# Patient Record
Sex: Female | Born: 1955 | Race: White | Hispanic: No | Marital: Married | Smoking: Never smoker
Health system: Southern US, Community
[De-identification: ages and names within clinical notes are randomized; demographics above are authoritative.]

## PROBLEM LIST (undated history)

## (undated) DIAGNOSIS — Z9851 Tubal ligation status: Secondary | ICD-10-CM

## (undated) DIAGNOSIS — Z9889 Other specified postprocedural states: Secondary | ICD-10-CM

## (undated) DIAGNOSIS — Z923 Personal history of irradiation: Secondary | ICD-10-CM

## (undated) DIAGNOSIS — C50919 Malignant neoplasm of unspecified site of unspecified female breast: Secondary | ICD-10-CM

## (undated) DIAGNOSIS — R251 Tremor, unspecified: Secondary | ICD-10-CM

## (undated) DIAGNOSIS — M1990 Unspecified osteoarthritis, unspecified site: Secondary | ICD-10-CM

## (undated) DIAGNOSIS — G43909 Migraine, unspecified, not intractable, without status migrainosus: Secondary | ICD-10-CM

## (undated) DIAGNOSIS — F419 Anxiety disorder, unspecified: Secondary | ICD-10-CM

## (undated) DIAGNOSIS — M810 Age-related osteoporosis without current pathological fracture: Secondary | ICD-10-CM

## (undated) DIAGNOSIS — C801 Malignant (primary) neoplasm, unspecified: Secondary | ICD-10-CM

## (undated) DIAGNOSIS — Z9221 Personal history of antineoplastic chemotherapy: Secondary | ICD-10-CM

## (undated) DIAGNOSIS — Z8489 Family history of other specified conditions: Secondary | ICD-10-CM

## (undated) DIAGNOSIS — K219 Gastro-esophageal reflux disease without esophagitis: Secondary | ICD-10-CM

## (undated) HISTORY — DX: Other specified postprocedural states: Z98.890

## (undated) HISTORY — PX: TUBAL LIGATION: SHX77

## (undated) HISTORY — DX: Anxiety disorder, unspecified: F41.9

## (undated) HISTORY — DX: Tubal ligation status: Z98.51

## (undated) HISTORY — DX: Age-related osteoporosis without current pathological fracture: M81.0

## (undated) HISTORY — DX: Unspecified osteoarthritis, unspecified site: M19.90

## (undated) HISTORY — DX: Migraine, unspecified, not intractable, without status migrainosus: G43.909

---

## 1994-01-30 HISTORY — PX: BREAST BIOPSY: SHX20

## 2003-11-17 ENCOUNTER — Ambulatory Visit: Payer: Self-pay | Admitting: Internal Medicine

## 2004-12-20 ENCOUNTER — Ambulatory Visit: Payer: Self-pay | Admitting: Internal Medicine

## 2005-12-22 ENCOUNTER — Ambulatory Visit: Payer: Self-pay | Admitting: Internal Medicine

## 2005-12-27 ENCOUNTER — Ambulatory Visit: Payer: Self-pay | Admitting: Internal Medicine

## 2006-12-25 ENCOUNTER — Ambulatory Visit: Payer: Self-pay | Admitting: Internal Medicine

## 2007-02-07 ENCOUNTER — Ambulatory Visit: Payer: Self-pay | Admitting: Gastroenterology

## 2007-12-27 ENCOUNTER — Ambulatory Visit: Payer: Self-pay | Admitting: Internal Medicine

## 2008-12-29 ENCOUNTER — Ambulatory Visit: Payer: Self-pay | Admitting: Internal Medicine

## 2010-01-05 ENCOUNTER — Ambulatory Visit: Payer: Self-pay | Admitting: Internal Medicine

## 2011-01-17 ENCOUNTER — Ambulatory Visit: Payer: Self-pay | Admitting: Internal Medicine

## 2012-01-18 ENCOUNTER — Ambulatory Visit: Payer: Self-pay | Admitting: Internal Medicine

## 2012-01-31 HISTORY — PX: WRIST FRACTURE SURGERY: SHX121

## 2012-07-27 ENCOUNTER — Emergency Department: Payer: Self-pay | Admitting: Emergency Medicine

## 2012-07-29 ENCOUNTER — Ambulatory Visit: Payer: Self-pay | Admitting: Orthopedic Surgery

## 2013-02-27 ENCOUNTER — Ambulatory Visit: Payer: Self-pay | Admitting: Internal Medicine

## 2014-05-22 NOTE — Op Note (Signed)
PATIENT NAME:  Marisa Evans, STRASSNER MR#:  [id number] DATE OF BIRTH:  [birth year]  DATE OF PROCEDURE:  07/29/2012  PREOPERATIVE DIAGNOSIS:  Volarly displaced distal radius fracture, two part fracture.  POSTOPERATIVE DIAGNOSIS:  Volarly displaced distal radius fracture, two part fracture.   PROCEDURE:  ORIF right distal radius.   ANESTHESIA:  General.   SURGEON:  Laurene Footman, M.D.   DESCRIPTION OF PROCEDURE:  The patient was brought to the Operating Room and after adequate anesthesia was obtained, the right arm was prepped and draped in the usual sterile fashion with a tourniquet applied to the upper arm.  After patient identification and timeout procedures were completed, the tourniquet was raised and volar approach was made to the distal radius.  Incision was made over the FCR tendon.  The tendon sheath was incised and the tendon retracted radially to protect the radial artery.  The deep tendon sheath was incised and the pronator elevated off the distal radius.  A narrow, short DVR plate was then applied to the appropriate position and the three cortical screws were placed.  This helped reduce the volarly displaced distal fragment to essentially an anatomic position.  The distal peg holes were then filled using standard technique, drilling, measuring and placing the smooth pegs.  After all peg holes were filled, the wrist was examined under fluoroscopy.  There was no penetration into the joint and the fracture appeared very stable.  The tourniquet was let down and hemostasis checked with electrocautery.  The wound was closed with 3-0 Vicryl subcutaneously, 4-0 nylon for the skin, Xeroform, 4 x 4's, Webril and a volar splint were applied followed by an Ace wrap.    TOURNIQUET TIME:  23 minutes at 250 mmHg.    COMPLICATIONS:  There were no complications.   SPECIMEN:  No specimen.    ____________________________ Laurene Footman, MD mjm:ea D: 07/29/2012 22:08:02 ET T: 07/29/2012 22:45:52  ET JOB#: [id number]  cc: Laurene Footman, MD, <Dictator> Laurene Footman MD ELECTRONICALLY SIGNED 07/30/2012 6:57

## 2014-12-17 ENCOUNTER — Other Ambulatory Visit: Payer: Self-pay | Admitting: Internal Medicine

## 2014-12-17 DIAGNOSIS — Z1231 Encounter for screening mammogram for malignant neoplasm of breast: Secondary | ICD-10-CM

## 2014-12-29 ENCOUNTER — Ambulatory Visit
Admission: RE | Admit: 2014-12-29 | Discharge: 2014-12-29 | Disposition: A | Discharge status: Home / Self Care | Payer: PRIVATE HEALTH INSURANCE | Source: Ambulatory Visit | Attending: Internal Medicine | Admitting: Internal Medicine

## 2014-12-29 DIAGNOSIS — R922 Inconclusive mammogram: Secondary | ICD-10-CM | POA: Insufficient documentation

## 2014-12-29 DIAGNOSIS — Z1231 Encounter for screening mammogram for malignant neoplasm of breast: Secondary | ICD-10-CM | POA: Diagnosis present

## 2014-12-31 ENCOUNTER — Other Ambulatory Visit: Payer: Self-pay | Admitting: Internal Medicine

## 2014-12-31 DIAGNOSIS — R928 Other abnormal and inconclusive findings on diagnostic imaging of breast: Secondary | ICD-10-CM

## 2015-01-31 DIAGNOSIS — Z9221 Personal history of antineoplastic chemotherapy: Secondary | ICD-10-CM

## 2015-01-31 DIAGNOSIS — Z923 Personal history of irradiation: Secondary | ICD-10-CM

## 2015-01-31 HISTORY — DX: Personal history of irradiation: Z92.3

## 2015-01-31 HISTORY — PX: BREAST LUMPECTOMY: SHX2

## 2015-01-31 HISTORY — PX: BREAST BIOPSY: SHX20

## 2015-01-31 HISTORY — DX: Personal history of antineoplastic chemotherapy: Z92.21

## 2015-02-03 ENCOUNTER — Ambulatory Visit
Admission: RE | Admit: 2015-02-03 | Discharge: 2015-02-03 | Disposition: A | Discharge status: Home / Self Care | Payer: Managed Care, Other (non HMO) | Source: Ambulatory Visit | Attending: Internal Medicine | Admitting: Internal Medicine

## 2015-02-03 DIAGNOSIS — R928 Other abnormal and inconclusive findings on diagnostic imaging of breast: Secondary | ICD-10-CM | POA: Insufficient documentation

## 2015-02-05 ENCOUNTER — Other Ambulatory Visit: Payer: Self-pay | Admitting: Internal Medicine

## 2015-02-05 DIAGNOSIS — N63 Unspecified lump in unspecified breast: Secondary | ICD-10-CM

## 2015-02-09 ENCOUNTER — Ambulatory Visit
Admission: RE | Admit: 2015-02-09 | Discharge: 2015-02-09 | Disposition: A | Discharge status: Home / Self Care | Payer: Managed Care, Other (non HMO) | Source: Ambulatory Visit | Attending: Internal Medicine | Admitting: Internal Medicine

## 2015-02-09 DIAGNOSIS — N63 Unspecified lump in unspecified breast: Secondary | ICD-10-CM

## 2015-02-09 DIAGNOSIS — C50212 Malignant neoplasm of upper-inner quadrant of left female breast: Secondary | ICD-10-CM | POA: Insufficient documentation

## 2015-02-11 ENCOUNTER — Inpatient Hospital Stay: Payer: Managed Care, Other (non HMO) | Attending: Oncology | Admitting: Oncology

## 2015-02-11 ENCOUNTER — Encounter: Payer: Self-pay | Admitting: Oncology

## 2015-02-11 VITALS — BP 145/87 | HR 77 | Temp 96.8°F | Wt 155.6 lb

## 2015-02-11 DIAGNOSIS — Z170 Estrogen receptor positive status [ER+]: Secondary | ICD-10-CM | POA: Diagnosis not present

## 2015-02-11 DIAGNOSIS — Z79899 Other long term (current) drug therapy: Secondary | ICD-10-CM | POA: Diagnosis not present

## 2015-02-11 DIAGNOSIS — Z808 Family history of malignant neoplasm of other organs or systems: Secondary | ICD-10-CM | POA: Diagnosis not present

## 2015-02-11 DIAGNOSIS — Z8669 Personal history of other diseases of the nervous system and sense organs: Secondary | ICD-10-CM | POA: Diagnosis not present

## 2015-02-11 DIAGNOSIS — M818 Other osteoporosis without current pathological fracture: Secondary | ICD-10-CM | POA: Insufficient documentation

## 2015-02-11 DIAGNOSIS — F419 Anxiety disorder, unspecified: Secondary | ICD-10-CM | POA: Insufficient documentation

## 2015-02-11 DIAGNOSIS — C50912 Malignant neoplasm of unspecified site of left female breast: Secondary | ICD-10-CM

## 2015-02-11 DIAGNOSIS — M129 Arthropathy, unspecified: Secondary | ICD-10-CM | POA: Diagnosis not present

## 2015-02-11 DIAGNOSIS — C50212 Malignant neoplasm of upper-inner quadrant of left female breast: Secondary | ICD-10-CM | POA: Insufficient documentation

## 2015-02-11 LAB — SURGICAL PATHOLOGY

## 2015-02-12 DIAGNOSIS — C50919 Malignant neoplasm of unspecified site of unspecified female breast: Secondary | ICD-10-CM | POA: Insufficient documentation

## 2015-02-12 DIAGNOSIS — C50812 Malignant neoplasm of overlapping sites of left female breast: Secondary | ICD-10-CM | POA: Insufficient documentation

## 2015-02-12 HISTORY — PX: BREAST BIOPSY: SHX20

## 2015-02-12 NOTE — Progress Notes (Signed)
[place]  [phone] [phone]) [phone]  ID: Marisa Evans OB: 1955/10/13  MR#: [id number]  DEY#:[id number]  No care team member to display  CHIEF COMPLAINT:  Chief Complaint  Patient presents with  . New Patient (Initial Visit)    new L breast mass    INTERVAL HISTORY: patient is a 60 year old female who was noted to have an abnormality on routine screening mammogram. Subsequent ultrasound and biopsy confirmed invasive lobular carcinoma. She currently feels well and is asymptomatic. She has no neurologic complaints. She denies any recent fevers. She has a good appetite and denies weight loss. She has no chest pain or shortness of breath. She denies any nausea, vomiting, constipation, or diarrhea. She has no urinary complaints. Patient feels at her baseline and offers no specific complaints today.  REVIEW OF SYSTEMS:   Review of Systems  Constitutional: Negative for fever, weight loss and malaise/fatigue.  Respiratory: Negative.   Cardiovascular: Negative.   Gastrointestinal: Negative.   Musculoskeletal: Negative.   Neurological: Negative.  Negative for weakness.    As per HPI. Otherwise, a complete review of systems is negatve.  PAST MEDICAL HISTORY: Past Medical History  Diagnosis Date  . Anxiety   . Migraine   . H/O tubal ligation   . Osteoporosis   . Arthritis   . H/O breast biopsy     PAST SURGICAL HISTORY: Past Surgical History  Procedure Laterality Date  . Breast biopsy Bilateral 1996    neg    FAMILY HISTORY Family History  Problem Relation Age of Onset  . Hypertension Father   . Cancer Maternal Uncle   . Cancer Paternal Uncle   . Liver disease Paternal Grandfather        ADVANCED DIRECTIVES:    HEALTH MAINTENANCE: Social History  Substance Use Topics  . Smoking status: Never Smoker   . Smokeless tobacco: None  . Alcohol Use: Yes     Comment: twice a year     Colonoscopy:  PAP:  Bone  density:  Lipid panel:  Allergies  Allergen Reactions  . Codeine Nausea Only  . Topamax [Topiramate] Other (See Comments)    Lightheaded, feels "out of body"    Current Outpatient Prescriptions  Medication Sig Dispense Refill  . alendronate (FOSAMAX) 70 MG tablet TAKE 1 TABLET BY MOUTH EVERY WEEK    . calcium-vitamin D 250-100 MG-UNIT tablet Take 2 tablets by mouth 2 (two) times daily.    Marland Kitchen glucosamine-chondroitin 500-400 MG tablet Take 1 tablet by mouth 3 (three) times daily.    . propranolol (INDERAL) 10 MG tablet Take 10 mg by mouth 2 (two) times daily.    Marland Kitchen venlafaxine XR (EFFEXOR-XR) 150 MG 24 hr capsule TAKE ONE CAPSULE BY MOUTH EVERY DAY    . Cholecalciferol (D 1000) 1000 units capsule Take 1,000 Units by mouth daily.    . Glucosamine Sulfate 500 MG TABS Take by mouth.     No current facility-administered medications for this visit.    OBJECTIVE: Filed Vitals:   02/11/15 1044  BP: 145/87  Pulse: 77  Temp: 96.8 F (36 C)     There is no height on file to calculate BMI.    ECOG FS:0 - Asymptomatic  General: Well-developed, well-nourished, no acute distress. Eyes: Pink conjunctiva, anicteric sclera. Breasts: Patient requested exam be deferred today. HEENT: Normocephalic, moist mucous membranes, clear oropharnyx. Lungs: Clear to auscultation bilaterally. Heart: Regular rate and rhythm. No rubs, murmurs, or gallops. Abdomen: Soft, nontender, nondistended. No organomegaly  noted, normoactive bowel sounds. Musculoskeletal: No edema, cyanosis, or clubbing. Neuro: Alert, answering all questions appropriately. Cranial nerves grossly intact. Skin: No rashes or petechiae noted. Psych: Normal affect. Lymphatics: No cervical, calvicular, axillary or inguinal LAD.   LAB RESULTS:  No results found for: NA, K, CL, CO2, GLUCOSE, BUN, CREATININE, CALCIUM, PROT, ALBUMIN, AST, ALT, ALKPHOS, BILITOT, GFRNONAA, GFRAA  No results found for: WBC, NEUTROABS, HGB, HCT, MCV,  PLT   STUDIES: Mm Digital Diagnostic Unilat L  02/09/2015  CLINICAL DATA:  Post biopsy clip mammograms following ultrasound-guided core needle biopsy of the left breast. EXAM: DIAGNOSTIC LEFT MAMMOGRAM POST ULTRASOUND BIOPSY COMPARISON:  Previous exam(s). FINDINGS: Mammographic images were obtained following altered guided biopsy of hypoechoic left breast mass and associated architectural distortion. The wing shaped biopsy clip lies within the area of distortion in the upper inner left breast. IMPRESSION: Well-positioned wing shaped biopsy clip following ultrasound-guided core needle biopsy of a left breast mass and associated distortion. Final Assessment: Post Procedure Mammograms for Marker Placement Electronically Signed   By: Lajean Manes M.D.   On: 02/09/2015 15:18   US Breast Ltd Uni Left Inc Axilla  02/03/2015  CLINICAL DATA:  Patient recalled from screening for left breast architectural distortion. EXAM: DIGITAL DIAGNOSTIC LEFT MAMMOGRAM WITH CAD ULTRASOUND LEFT BREAST COMPARISON:  Previous exam(s). ACR Breast Density Category c: The breast tissue is heterogeneously dense, which may obscure small masses. FINDINGS: CC and MLO tomosynthesis images of the left breast were obtained, demonstrating persistent architectural distortion within the upper inner left breast middle to posterior depth with associated 9 mm mass. Magnification views of the upper inner left breast were obtained as well demonstrating approximately 3 cm of associated punctate calcifications extending from the mass anteriorly towards the nipple. Punctate calcifications within the upper-outer left breast are stable dating back to 2005. Mammographic images were processed with CAD. On physical exam, I palpate no discrete mass within the upper-outer left breast. Targeted ultrasound is performed, showing a 2.0 x 0.7 x 1.4 cm irregular hypoechoic left breast mass 11 o'clock position 4 cm from the nipple. No left axillary lymphadenopathy.  IMPRESSION: Suspicious left breast mass, concerning for primary breast malignancy. 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 RECOMMENDATION: Ultrasound-guided core needle biopsy left breast mass, concerning for malignancy. Depending on pathology, consider either stereotactic guided core needle biopsy of the anterior most aspect of the calcifications or preoperative localization for inclusion of the calcifications at time of surgery. I have discussed the findings and recommendations with the patient. Results were also provided in writing at the conclusion of the visit. If applicable, a reminder letter will be sent to the patient regarding the next appointment. BI-RADS CATEGORY  5: Highly suggestive of malignancy. Electronically Signed   By: Lovey Newcomer M.D.   On: 02/03/2015 12:58   Mm Diag Breast Tomo Uni Left  02/03/2015  CLINICAL DATA:  Patient recalled from screening for left breast architectural distortion. EXAM: DIGITAL DIAGNOSTIC LEFT MAMMOGRAM WITH CAD ULTRASOUND LEFT BREAST COMPARISON:  Previous exam(s). ACR Breast Density Category c: The breast tissue is heterogeneously dense, which may obscure small masses. FINDINGS: CC and MLO tomosynthesis images of the left breast were obtained, demonstrating persistent architectural distortion within the upper inner left breast middle to posterior depth with associated 9 mm mass. Magnification views of the upper inner left breast were obtained as well demonstrating approximately 3 cm of associated punctate calcifications extending  from the mass anteriorly towards the nipple. Punctate calcifications within the upper-outer left breast are stable dating back to 2005. Mammographic images were processed with CAD. On physical exam, I palpate no discrete mass within the upper-outer left breast. Targeted ultrasound is performed, showing a 2.0 x 0.7 x 1.4 cm irregular  hypoechoic left breast mass 11 o'clock position 4 cm from the nipple. No left axillary lymphadenopathy. IMPRESSION: Suspicious left breast mass, concerning for primary breast malignancy. 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 RECOMMENDATION: Ultrasound-guided core needle biopsy left breast mass, concerning for malignancy. Depending on pathology, consider either stereotactic guided core needle biopsy of the anterior most aspect of the calcifications or preoperative localization for inclusion of the calcifications at time of surgery. I have discussed the findings and recommendations with the patient. Results were also provided in writing at the conclusion of the visit. If applicable, a reminder letter will be sent to the patient regarding the next appointment. BI-RADS CATEGORY  5: Highly suggestive of malignancy. Electronically Signed   By: Lovey Newcomer M.D.   On: 02/03/2015 12:58   [place] Lt Breast Bx W Loc Dev 1st Lesion Img Bx Spec US Guide  02/09/2015  CLINICAL DATA:  Patient presents for ultrasound-guided core needle biopsy of irregular hypoechoic mass in the left breast at the 11 o'clock position. EXAM: ULTRASOUND GUIDED LEFT BREAST CORE NEEDLE BIOPSY COMPARISON:  Previous exam(s). PROCEDURE: I met with the patient and we discussed the procedure of ultrasound-guided biopsy, including benefits and alternatives. We discussed the high likelihood of a successful procedure. We discussed the risks of the procedure including infection, bleeding, tissue injury, clip migration, and inadequate sampling. Informed written consent was given. The usual time-out protocol was performed immediately prior to the procedure. Using sterile technique and 1% Lidocaine as local anesthetic, under direct ultrasound visualization, a 12 gauge vacuum-assisted device was used to perform biopsy of the hypoechoic left breast  massusing an inferior approach. At the conclusion of the procedure, a wing shaped tissue marker clip was deployed into the biopsy cavity. Follow-up 2-view mammogram was performed and dictated separately. IMPRESSION: Ultrasound-guided biopsy of a left breast mass. No apparent complications. Electronically Signed   By: Lajean Manes M.D.   On: 02/09/2015 15:06    ASSESSMENT: Clinical stage IIa ER/PR+, invasive lobular carcinoma of the left breast.  PLAN:    1. Breast cancer:  Given the stage of patient's disease, she may benefit from chemotherapy. Although patient's with invasive lobular carcinoma may not respond to chemotherapy. Patient will have consultation with surgery in the next 1-2 weeks to discuss lumpectomy versus mastectomy. If she requires chemotherapy, she may need port placement as well. She will probably require an MRI to determine the true extent of her malignancy since lobular carcinomas may not show completely on mammogram. She may or may not benefit from radiation therapy depending on the type of surgery she chooses. Ultimately, patient will require aromatase inhibitor for a minimum of 5 years. Return to clinic in 2 weeks after her surgical appointment for further treatment planning.  Approximately 45 minutes was spent in discussion of which greater than 50% was consultation.  Patient expressed understanding and was in agreement with this plan. She also understands that She can call clinic at any time with any questions, concerns, or complaints.   Breast cancer [facility])   Staging form: Breast, AJCC 7th Edition     Clinical stage  from 02/12/2015: Stage IIA (T2, N0, M0) - Signed by Lloyd Huger, MD on 02/12/2015   Lloyd Huger, MD   02/12/2015 1:49 PM

## 2015-02-25 ENCOUNTER — Other Ambulatory Visit: Payer: Managed Care, Other (non HMO)

## 2015-02-25 ENCOUNTER — Inpatient Hospital Stay (HOSPITAL_BASED_OUTPATIENT_CLINIC_OR_DEPARTMENT_OTHER): Payer: Managed Care, Other (non HMO) | Admitting: Oncology

## 2015-02-25 ENCOUNTER — Encounter: Payer: Self-pay | Admitting: *Deleted

## 2015-02-25 VITALS — BP 131/86 | HR 76 | Temp 97.7°F | Resp 18 | Wt 155.6 lb

## 2015-02-25 DIAGNOSIS — Z8669 Personal history of other diseases of the nervous system and sense organs: Secondary | ICD-10-CM

## 2015-02-25 DIAGNOSIS — C50212 Malignant neoplasm of upper-inner quadrant of left female breast: Secondary | ICD-10-CM

## 2015-02-25 DIAGNOSIS — Z808 Family history of malignant neoplasm of other organs or systems: Secondary | ICD-10-CM

## 2015-02-25 DIAGNOSIS — M129 Arthropathy, unspecified: Secondary | ICD-10-CM

## 2015-02-25 DIAGNOSIS — M818 Other osteoporosis without current pathological fracture: Secondary | ICD-10-CM

## 2015-02-25 DIAGNOSIS — C50912 Malignant neoplasm of unspecified site of left female breast: Secondary | ICD-10-CM

## 2015-02-25 DIAGNOSIS — Z170 Estrogen receptor positive status [ER+]: Secondary | ICD-10-CM | POA: Diagnosis not present

## 2015-02-25 DIAGNOSIS — F419 Anxiety disorder, unspecified: Secondary | ICD-10-CM | POA: Diagnosis not present

## 2015-02-25 DIAGNOSIS — Z79899 Other long term (current) drug therapy: Secondary | ICD-10-CM | POA: Diagnosis not present

## 2015-02-25 MED ORDER — LIDOCAINE-PRILOCAINE 2.5-2.5 % EX CREA: 1.0000 "application " | TOPICAL_CREAM | CUTANEOUS | Status: DC | PRN

## 2015-02-25 NOTE — Progress Notes (Signed)
Patient is scheduled for port placement on 03/02/15.

## 2015-02-25 NOTE — Patient Instructions (Signed)
  Your procedure is scheduled on: 03-02-15 Report to [place] To find out your arrival time please call [phone] between 1PM - 3PM on 03-01-15  Remember: Instructions that are not followed completely may result in serious medical risk, up to and including death, or upon the discretion of your surgeon and anesthesiologist your surgery may need to be rescheduled.    _X___ 1. Do not eat food or drink liquids after midnight. No gum chewing or hard candies.     _X___ 2. No Alcohol for 24 hours before or after surgery.   ____ 3. Bring all medications with you on the day of surgery if instructed.    ____ 4. Notify your doctor if there is any change in your medical condition     (cold, fever, infections).     Do not wear jewelry, make-up, hairpins, clips or nail polish.  Do not wear lotions, powders, or perfumes. You may wear deodorant.  Do not shave 48 hours prior to surgery. Men may shave face and neck.  Do not bring valuables to the hospital.    [facility] is not responsible for any belongings or valuables.               Contacts, dentures or bridgework may not be worn into surgery.  Leave your suitcase in the car. After surgery it may be brought to your room.  For patients admitted to the hospital, discharge time is determined by your treatment team.   Patients discharged the day of surgery will not be allowed to drive home.   Please read over the following fact sheets that you were given:    _X___ Take these medicines the morning of surgery with A SIP OF WATER:    1. PROPRANOLOL  2.   3.   4.  5.  6.  ____ Fleet Enema (as directed)   ____ Use CHG Soap as directed  ____ Use inhalers on the day of surgery  ____ Stop metformin 2 days prior to surgery    ____ Take 1/2 of usual insulin dose the night before surgery and none on the morning of surgery.   ____ Stop Coumadin/Plavix/aspirin-N/A  ____ Stop Anti-inflammatories-NO NSAIDS OR ASA  PRODUCTS-TYLENOL OK TO TAKE   _X___ Stop supplements until after surgery-STOP GLUCOSAMINE AND OSTEO-BIFLEX NOW  ____ Bring C-Pap to the hospital.

## 2015-02-28 NOTE — Progress Notes (Signed)
[place]  [phone] [phone]  ID: Marisa Evans OB: 03/22/1955  MR#: [id number]  JAS#:[id number]  Patient Care Team: Adin Hector, MD as PCP - General (Internal Medicine)  CHIEF COMPLAINT:  Chief Complaint  Patient presents with  . Breast Cancer    INTERVAL HISTORY: Patient returns to clinic today for further evaluation, diagnostic planning, and treatment planning. She currently feels well and is asymptomatic. She has no neurologic complaints. She denies any recent fevers. She has a good appetite and denies weight loss. She has no chest pain or shortness of breath. She denies any nausea, vomiting, constipation, or diarrhea. She has no urinary complaints. Patient offers no specific complaints today.  REVIEW OF SYSTEMS:   Review of Systems  Constitutional: Negative for fever, weight loss and malaise/fatigue.  Respiratory: Negative.   Cardiovascular: Negative.   Gastrointestinal: Negative.   Musculoskeletal: Negative.   Neurological: Negative.  Negative for weakness.    As per HPI. Otherwise, a complete review of systems is negatve.  PAST MEDICAL HISTORY: Past Medical History  Diagnosis Date  . Anxiety   . Migraine   . H/O tubal ligation   . Osteoporosis   . Arthritis   . H/O breast biopsy   . GERD (gastroesophageal reflux disease)     NO MEDS  . Cancer ([place])   . Family history of adverse reaction to anesthesia     PTS UNCLE   . Tremor     TAKES PROPRANOLOL FOR TREMORS    PAST SURGICAL HISTORY: Past Surgical History  Procedure Laterality Date  . Wrist fracture surgery Right 2014  . Breast biopsy Bilateral 1996    neg  . Breast biopsy Left 2017    FAMILY HISTORY Family History  Problem Relation Age of Onset  . Hypertension Father   . Cancer Maternal Uncle   . Cancer Paternal Uncle   . Liver disease Paternal Grandfather        ADVANCED DIRECTIVES:    HEALTH MAINTENANCE: Social History  Substance Use  Topics  . Smoking status: Never Smoker   . Smokeless tobacco: Not on file  . Alcohol Use: No     Colonoscopy:  PAP:  Bone density:  Lipid panel:  Allergies  Allergen Reactions  . Codeine Nausea Only  . Topamax [Topiramate] Other (See Comments)    Lightheaded, feels "out of body"    Current Outpatient Prescriptions  Medication Sig Dispense Refill  . alendronate (FOSAMAX) 70 MG tablet TAKE 1 TABLET BY MOUTH EVERY WEEK    . calcium-vitamin D 250-100 MG-UNIT tablet Take 2 tablets by mouth 2 (two) times daily. Reported on 02/25/2015    . Cholecalciferol (D 1000) 1000 units capsule Take 1,000 Units by mouth daily.    . Glucosamine Sulfate 500 MG TABS Take by mouth. Reported on 02/25/2015    . lidocaine-prilocaine (EMLA) cream Apply 1 application topically as needed. Apply to port then cover with saran wrap, 1-2 hours before chemotherapy appointment 30 g 2  . propranolol (INDERAL) 10 MG tablet Take 10 mg by mouth 2 (two) times daily.    Marland Kitchen venlafaxine XR (EFFEXOR-XR) 150 MG 24 hr capsule TAKE ONE CAPSULE BY MOUTH EVERY DAY-PM    . Misc Natural Products (OSTEO BI-FLEX ADV DOUBLE ST) TABS Take 1 tablet by mouth daily.     No current facility-administered medications for this visit.    OBJECTIVE: Filed Vitals:   02/25/15 1147  BP: 131/86  Pulse: 76  Temp: 97.7 F (  36.5 C)  Resp: 18     There is no height on file to calculate BMI.    ECOG FS:0 - Asymptomatic  General: Well-developed, well-nourished, no acute distress. Eyes: Pink conjunctiva, anicteric sclera. Breasts: Patient requested exam be deferred today. Lungs: Clear to auscultation bilaterally. Heart: Regular rate and rhythm. No rubs, murmurs, or gallops. Abdomen: Soft, nontender, nondistended. No organomegaly noted, normoactive bowel sounds. Musculoskeletal: No edema, cyanosis, or clubbing. Neuro: Alert, answering all questions appropriately. Cranial nerves grossly intact. Skin: No rashes or petechiae noted. Psych: Normal  affect.  LAB RESULTS:  No results found for: NA, K, CL, CO2, GLUCOSE, BUN, CREATININE, CALCIUM, PROT, ALBUMIN, AST, ALT, ALKPHOS, BILITOT, GFRNONAA, GFRAA  No results found for: WBC, NEUTROABS, HGB, HCT, MCV, PLT   STUDIES: Mm Digital Diagnostic Unilat L  02/09/2015  CLINICAL DATA:  Post biopsy clip mammograms following ultrasound-guided core needle biopsy of the left breast. EXAM: DIAGNOSTIC LEFT MAMMOGRAM POST ULTRASOUND BIOPSY COMPARISON:  Previous exam(s). FINDINGS: Mammographic images were obtained following altered guided biopsy of hypoechoic left breast mass and associated architectural distortion. The wing shaped biopsy clip lies within the area of distortion in the upper inner left breast. IMPRESSION: Well-positioned wing shaped biopsy clip following ultrasound-guided core needle biopsy of a left breast mass and associated distortion. Final Assessment: Post Procedure Mammograms for Marker Placement Electronically Signed   By: Lajean Manes M.D.   On: 02/09/2015 15:18   US Breast Ltd Uni Left Inc Axilla  02/03/2015  CLINICAL DATA:  Patient recalled from screening for left breast architectural distortion. EXAM: DIGITAL DIAGNOSTIC LEFT MAMMOGRAM WITH CAD ULTRASOUND LEFT BREAST COMPARISON:  Previous exam(s). ACR Breast Density Category c: The breast tissue is heterogeneously dense, which may obscure small masses. FINDINGS: CC and MLO tomosynthesis images of the left breast were obtained, demonstrating persistent architectural distortion within the upper inner left breast middle to posterior depth with associated 9 mm mass. Magnification views of the upper inner left breast were obtained as well demonstrating approximately 3 cm of associated punctate calcifications extending from the mass anteriorly towards the nipple. Punctate calcifications within the upper-outer left breast are stable dating back to 2005. Mammographic images were processed with CAD. On physical exam, I palpate no discrete mass  within the upper-outer left breast. Targeted ultrasound is performed, showing a 2.0 x 0.7 x 1.4 cm irregular hypoechoic left breast mass 11 o'clock position 4 cm from the nipple. No left axillary lymphadenopathy. IMPRESSION: Suspicious left breast mass, concerning for primary breast malignancy. 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 RECOMMENDATION: Ultrasound-guided core needle biopsy left breast mass, concerning for malignancy. Depending on pathology, consider either stereotactic guided core needle biopsy of the anterior most aspect of the calcifications or preoperative localization for inclusion of the calcifications at time of surgery. I have discussed the findings and recommendations with the patient. Results were also provided in writing at the conclusion of the visit. If applicable, a reminder letter will be sent to the patient regarding the next appointment. BI-RADS CATEGORY  5: Highly suggestive of malignancy. Electronically Signed   By: Lovey Newcomer M.D.   On: 02/03/2015 12:58   Mm Diag Breast Tomo Uni Left  02/03/2015  CLINICAL DATA:  Patient recalled from screening for left breast architectural distortion. EXAM: DIGITAL DIAGNOSTIC LEFT MAMMOGRAM WITH CAD ULTRASOUND LEFT BREAST COMPARISON:  Previous exam(s). ACR Breast Density Category c: The breast tissue is heterogeneously dense, which  may obscure small masses. FINDINGS: CC and MLO tomosynthesis images of the left breast were obtained, demonstrating persistent architectural distortion within the upper inner left breast middle to posterior depth with associated 9 mm mass. Magnification views of the upper inner left breast were obtained as well demonstrating approximately 3 cm of associated punctate calcifications extending from the mass anteriorly towards the nipple. Punctate calcifications within the upper-outer left breast are stable dating  back to 2005. Mammographic images were processed with CAD. On physical exam, I palpate no discrete mass within the upper-outer left breast. Targeted ultrasound is performed, showing a 2.0 x 0.7 x 1.4 cm irregular hypoechoic left breast mass 11 o'clock position 4 cm from the nipple. No left axillary lymphadenopathy. IMPRESSION: Suspicious left breast mass, concerning for primary breast malignancy. 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 RECOMMENDATION: Ultrasound-guided core needle biopsy left breast mass, concerning for malignancy. Depending on pathology, consider either stereotactic guided core needle biopsy of the anterior most aspect of the calcifications or preoperative localization for inclusion of the calcifications at time of surgery. I have discussed the findings and recommendations with the patient. Results were also provided in writing at the conclusion of the visit. If applicable, a reminder letter will be sent to the patient regarding the next appointment. BI-RADS CATEGORY  5: Highly suggestive of malignancy. Electronically Signed   By: Lovey Newcomer M.D.   On: 02/03/2015 12:58   [place] Lt Breast Bx W Loc Dev 1st Lesion Img Bx Spec US Guide  02/12/2015  ADDENDUM REPORT: 02/12/2015 14:25 ADDENDUM: Pathology of the left breast biopsy revealed INVASIVE LOBULAR CARCINOMA, SOLID VARIANT. LOBULAR CARCINOMA IN SITU. Histologic grade: Grade 2. Comment: Immunohistochemistry for E-cadherin was performed, and the invasive and in situ carcinoma are negative, confirming the above diagnosis. There is only focal lobular carcinoma in situ in this sample, and no calcifications are observed. No lymphovascular invasion is identified. The diagnosis of invasive mammary carcinoma was communicated to Dr. Caryl Comes on 02/10/15 at 1:40 PM; he was notified that further classification and marker studies were pending. The final  report was communicated to Al Pimple, [place] at [facility], on 02/10/14, at 11:52 by pathologist. Read-back was performed. This was found to be concordant by Dr. Autumn Patty. Recommendations:  Recommend surgical and oncology referral. Multiple attempts were made to contact the patient by Jetta Lout, [facility]) for a post biopsy check but were unsuccessful. According to notes in Epic, the results were discussed with the patient by Dr. Grayland Ormond, oncologist. Surgical appointment is pending. Addendum by Jetta Lout, RRA on 02/12/15. Electronically Signed   By: Lajean Manes M.D.   On: 02/12/2015 14:25  02/12/2015  CLINICAL DATA:  Patient presents for ultrasound-guided core needle biopsy of irregular hypoechoic mass in the left breast at the 11 o'clock position. EXAM: ULTRASOUND GUIDED LEFT BREAST CORE NEEDLE BIOPSY COMPARISON:  Previous exam(s). PROCEDURE: I met with the patient and we discussed the procedure of ultrasound-guided biopsy, including benefits and alternatives. We discussed the high likelihood of a successful procedure. We discussed the risks of the procedure including infection, bleeding, tissue injury, clip migration, and inadequate sampling. Informed written consent was given. The usual time-out protocol was performed immediately prior to the procedure. Using sterile technique and 1% Lidocaine as local anesthetic, under direct ultrasound visualization, a 12 gauge vacuum-assisted device was used to perform biopsy of the hypoechoic left breast massusing an  inferior approach. At the conclusion of the procedure, a wing shaped tissue marker clip was deployed into the biopsy cavity. Follow-up 2-view mammogram was performed and dictated separately. IMPRESSION: Ultrasound-guided biopsy of a left breast mass. No apparent complications. Electronically Signed: By: Lajean Manes M.D. On: 02/09/2015 15:06    ASSESSMENT: Clinical stage IIa ER/PR+, invasive lobular carcinoma  of the left breast.  PLAN:    1. Breast cancer:  Given the stage of patient's disease, she may benefit from chemotherapy. Although patient's with invasive lobular carcinoma may not respond to chemotherapy. We will get MRI of the breast prior to initiating chemotherapy to further evaluate her lesion. Appreciate surgical input, patient has port placement scheduled for March 02, 2015.  She may or may not benefit from radiation therapy depending on the type of surgery she chooses. Ultimately, patient will require aromatase inhibitor for a minimum of 5 years. Will get MUGA scan prior to initiating treatment. Return to clinic in 2 weeks to initiate cycle 1 of 4 of neoadjuvant Adriamycin and Cytoxan. This will be followed by 12 weeks of Taxol.   Approximately 30 minutes was spent in discussion of which greater than 50% was consultation.  Patient expressed understanding and was in agreement with this plan. She also understands that She can call clinic at any time with any questions, concerns, or complaints.   Breast cancer [facility])   Staging form: Breast, AJCC 7th Edition     Clinical stage from 02/12/2015: Stage IIA (T2, N0, M0) - Signed by Lloyd Huger, MD on 02/12/2015   Lloyd Huger, MD   02/28/2015 7:10 AM

## 2015-03-01 ENCOUNTER — Other Ambulatory Visit: Payer: Self-pay | Admitting: Oncology

## 2015-03-01 ENCOUNTER — Encounter: Payer: Self-pay | Admitting: *Deleted

## 2015-03-01 DIAGNOSIS — C50912 Malignant neoplasm of unspecified site of left female breast: Secondary | ICD-10-CM

## 2015-03-01 MED ORDER — ONDANSETRON HCL 8 MG PO TABS: 8.0000 mg | ORAL_TABLET | Freq: Two times a day (BID) | ORAL | Status: DC | PRN

## 2015-03-01 MED ORDER — PROCHLORPERAZINE MALEATE 10 MG PO TABS: 10.0000 mg | ORAL_TABLET | Freq: Four times a day (QID) | ORAL | Status: DC | PRN

## 2015-03-01 MED ORDER — LIDOCAINE-PRILOCAINE 2.5-2.5 % EX CREA: TOPICAL_CREAM | CUTANEOUS | Status: DC

## 2015-03-02 ENCOUNTER — Encounter: Payer: Self-pay | Admitting: *Deleted

## 2015-03-02 ENCOUNTER — Encounter
Admission: RE | Disposition: A | Discharge status: Home / Self Care | Payer: Self-pay | Source: Ambulatory Visit | Attending: Surgery

## 2015-03-02 ENCOUNTER — Ambulatory Visit: Payer: Managed Care, Other (non HMO)

## 2015-03-02 ENCOUNTER — Ambulatory Visit: Payer: Managed Care, Other (non HMO) | Admitting: Anesthesiology

## 2015-03-02 ENCOUNTER — Ambulatory Visit
Admission: RE | Admit: 2015-03-02 | Discharge: 2015-03-02 | Disposition: A | Discharge status: Home / Self Care | Payer: Managed Care, Other (non HMO) | Source: Ambulatory Visit | Attending: Surgery | Admitting: Surgery

## 2015-03-02 DIAGNOSIS — Z79899 Other long term (current) drug therapy: Secondary | ICD-10-CM | POA: Insufficient documentation

## 2015-03-02 DIAGNOSIS — Z8261 Family history of arthritis: Secondary | ICD-10-CM | POA: Diagnosis not present

## 2015-03-02 DIAGNOSIS — R251 Tremor, unspecified: Secondary | ICD-10-CM | POA: Diagnosis not present

## 2015-03-02 DIAGNOSIS — Z8249 Family history of ischemic heart disease and other diseases of the circulatory system: Secondary | ICD-10-CM | POA: Diagnosis not present

## 2015-03-02 DIAGNOSIS — Z170 Estrogen receptor positive status [ER+]: Secondary | ICD-10-CM | POA: Diagnosis not present

## 2015-03-02 DIAGNOSIS — C50212 Malignant neoplasm of upper-inner quadrant of left female breast: Secondary | ICD-10-CM | POA: Insufficient documentation

## 2015-03-02 DIAGNOSIS — Z825 Family history of asthma and other chronic lower respiratory diseases: Secondary | ICD-10-CM | POA: Insufficient documentation

## 2015-03-02 DIAGNOSIS — D72819 Decreased white blood cell count, unspecified: Secondary | ICD-10-CM | POA: Diagnosis not present

## 2015-03-02 DIAGNOSIS — Z8042 Family history of malignant neoplasm of prostate: Secondary | ICD-10-CM | POA: Insufficient documentation

## 2015-03-02 DIAGNOSIS — Z8262 Family history of osteoporosis: Secondary | ICD-10-CM | POA: Diagnosis not present

## 2015-03-02 DIAGNOSIS — M810 Age-related osteoporosis without current pathological fracture: Secondary | ICD-10-CM | POA: Diagnosis not present

## 2015-03-02 DIAGNOSIS — Z9889 Other specified postprocedural states: Secondary | ICD-10-CM

## 2015-03-02 DIAGNOSIS — G43909 Migraine, unspecified, not intractable, without status migrainosus: Secondary | ICD-10-CM | POA: Insufficient documentation

## 2015-03-02 HISTORY — DX: Family history of other specified conditions: Z84.89

## 2015-03-02 HISTORY — DX: Tremor, unspecified: R25.1

## 2015-03-02 HISTORY — PX: PORTACATH PLACEMENT: SHX2246

## 2015-03-02 HISTORY — DX: Gastro-esophageal reflux disease without esophagitis: K21.9

## 2015-03-02 HISTORY — DX: Malignant (primary) neoplasm, unspecified: C80.1

## 2015-03-02 SURGERY — INSERTION, TUNNELED CENTRAL VENOUS DEVICE, WITH PORT
Anesthesia: Monitor Anesthesia Care | Site: Chest | Laterality: Right | Wound class: Clean

## 2015-03-02 MED ORDER — CEFAZOLIN SODIUM 1-5 GM-% IV SOLN
1.0000 g | Freq: Once | INTRAVENOUS | Status: AC
  Administered 2015-03-02: 1 g via INTRAVENOUS

## 2015-03-02 MED ORDER — ONDANSETRON HCL 4 MG/2ML IJ SOLN: 4.0000 mg | Freq: Once | INTRAMUSCULAR | Status: DC | PRN

## 2015-03-02 MED ORDER — SODIUM CHLORIDE 0.9 % IJ SOLN
INTRAMUSCULAR | Status: AC
  Filled 2015-03-02: qty 50

## 2015-03-02 MED ORDER — MIDAZOLAM HCL 2 MG/2ML IJ SOLN
INTRAMUSCULAR | Status: DC | PRN
  Administered 2015-03-02: 2 mg via INTRAVENOUS

## 2015-03-02 MED ORDER — HEPARIN SODIUM (PORCINE) 5000 UNIT/ML IJ SOLN
INTRAMUSCULAR | Status: AC
  Filled 2015-03-02: qty 1

## 2015-03-02 MED ORDER — PROPOFOL 500 MG/50ML IV EMUL
INTRAVENOUS | Status: DC | PRN
  Administered 2015-03-02: 50 ug/kg/min via INTRAVENOUS

## 2015-03-02 MED ORDER — FAMOTIDINE 20 MG PO TABS
20.0000 mg | ORAL_TABLET | Freq: Once | ORAL | Status: AC
  Administered 2015-03-02: 20 mg via ORAL

## 2015-03-02 MED ORDER — FENTANYL CITRATE (PF) 100 MCG/2ML IJ SOLN: 25.0000 ug | INTRAMUSCULAR | Status: DC | PRN

## 2015-03-02 MED ORDER — LIDOCAINE HCL (PF) 1 % IJ SOLN
INTRAMUSCULAR | Status: DC | PRN
  Administered 2015-03-02: 8 mL

## 2015-03-02 MED ORDER — LIDOCAINE HCL (PF) 1 % IJ SOLN
INTRAMUSCULAR | Status: AC
Start: 2015-03-02 — End: 2015-03-02
  Filled 2015-03-02: qty 30

## 2015-03-02 MED ORDER — FENTANYL CITRATE (PF) 100 MCG/2ML IJ SOLN
INTRAMUSCULAR | Status: DC | PRN
  Administered 2015-03-02: 25 ug via INTRAVENOUS

## 2015-03-02 MED ORDER — SODIUM CHLORIDE 0.9 % IV SOLN
INTRAVENOUS | Status: DC | PRN
  Administered 2015-03-02: 12 mL via INTRAMUSCULAR

## 2015-03-02 MED ORDER — FAMOTIDINE 20 MG PO TABS
ORAL_TABLET | ORAL | Status: AC
  Administered 2015-03-02: 20 mg via ORAL
  Filled 2015-03-02: qty 1

## 2015-03-02 MED ORDER — LIDOCAINE HCL (PF) 1 % IJ SOLN
INTRAMUSCULAR | Status: AC
  Filled 2015-03-02: qty 30

## 2015-03-02 MED ORDER — CEFAZOLIN SODIUM 1-5 GM-% IV SOLN
INTRAVENOUS | Status: AC
  Administered 2015-03-02: 1 g via INTRAVENOUS
  Filled 2015-03-02: qty 50

## 2015-03-02 MED ORDER — LACTATED RINGERS IV SOLN
INTRAVENOUS | Status: DC
  Administered 2015-03-02: 15:00:00 via INTRAVENOUS

## 2015-03-02 SURGICAL SUPPLY — 23 items
CANISTER SUCT 1200ML W/VALVE (MISCELLANEOUS) ×3 IMPLANT
CHLORAPREP W/TINT 26ML (MISCELLANEOUS) ×3 IMPLANT
COVER LIGHT HANDLE STERIS (MISCELLANEOUS) ×6 IMPLANT
DRAPE C-ARM XRAY 36X54 (DRAPES) ×3 IMPLANT
ELECT REM PT RETURN 9FT ADLT (ELECTROSURGICAL) ×3
ELECTRODE REM PT RTRN 9FT ADLT (ELECTROSURGICAL) ×1 IMPLANT
GLOVE BIO SURGEON STRL SZ7.5 (GLOVE) ×3 IMPLANT
GOWN STRL REUS W/ TWL LRG LVL3 (GOWN DISPOSABLE) ×3 IMPLANT
GOWN STRL REUS W/TWL LRG LVL3 (GOWN DISPOSABLE) ×6
KIT PORT POWER 8FR ISP CVUE (Catheter) ×3 IMPLANT
KIT RM TURNOVER STRD PROC AR (KITS) ×3 IMPLANT
LABEL OR SOLS (LABEL) IMPLANT
LIQUID BAND (GAUZE/BANDAGES/DRESSINGS) ×3 IMPLANT
NEEDLE FILTER BLUNT 18X 1/2SAF (NEEDLE) ×2
NEEDLE FILTER BLUNT 18X1 1/2 (NEEDLE) ×1 IMPLANT
PACK PORT-A-CATH (MISCELLANEOUS) ×3 IMPLANT
SUT MNCRL+ 5-0 UNDYED PC-3 (SUTURE) ×1 IMPLANT
SUT MON AB 5-0 P3 18 (SUTURE) IMPLANT
SUT MONOCRYL 5-0 (SUTURE) ×2
SUT SILK 2 0 SH (SUTURE) ×3 IMPLANT
SUT VIC AB 5-0 RB1 27 (SUTURE) ×3 IMPLANT
SYR 3ML LL SCALE MARK (SYRINGE) ×3 IMPLANT
SYRINGE 10CC LL (SYRINGE) ×3 IMPLANT

## 2015-03-02 NOTE — H&P (Signed)
  She comes in today for insertion of central venous catheter with subcutaneous infusion port. Her breast cancer is on the left side. The plan is to put the port on the right side. The right side was marked YES  She reports no change in condition since the day of the office visit  Plan for surgery was discussed

## 2015-03-02 NOTE — Op Note (Signed)
OPERATIVE REPORT  DIAGNOSIS: Breast cancer  PROCEDURE: Insertion of central venous catheter with subcutaneous infusion port.  SURGEON: Rochel Brome M.D.  ANESTHESIA: Monitored anesthesia care, local 1% Xylocaine  INDICATIONS: She had recent findings of left breast cancer now needing central venous access for chemotherapy.   With the patient on the operating table in the supine position a rolled sheet was placed behind the shoulder blades to extend the neck. She was monitored by the anesthesia staff and sedated. The neck and chest wall were prepared with ChloraPrep and draped in a sterile manner.  The skin beneath the right clavicle was infiltrated with 1% Xylocaine. A transversely oriented 3 cm incision was made and carried down through subcutaneous tissues. Several small bleeding points were cauterized. A subcutaneous pouch was created large enough to admit the ClearView port. The jugular vein was identified with ultrasound. The skin overlying the jugular vein was infiltrated with 1% Xylocaine. A transversely oriented 5 mm incision was made and carried down through subcutaneous tissues. A needle was inserted into the jugular vein using ultrasound guidance. An image was saved for the paper chart. A guidewire was advanced down through the needle into the central circulation. Fluoroscopy was used to demonstrate the location of the guidewire in the vena cava. The dilator and introducer sheath were advanced over the guidewire. The guidewire and dilator were removed. The catheter was placed down through the sheath and the sheath was peeled away. Fluoroscopy was used to demonstrate the tip of the catheter in the superior vena cava. An image was saved for the paper chart. The catheter was tunneled to the subclavian incision and pressure held over the tunnel site. The catheter was cut to fit and attached to the ClearView port and accessed with a Huber needle aspirating a trace of blood and flushing with 5 cc  of heparinized saline solution. The port was placed into the subcutaneous pouch and sutured to the deep fascia with 2-0 silk. Hemostasis was intact. Subcutaneous tissues were approximated with 5-0 Vicryl. Both skin incisions were closed with 5-0 Vicryl subcuticular suture and LiquiBand. The patient tolerated surgery satisfactorily and was prepared for transfer to the recovery room.

## 2015-03-02 NOTE — Patient Instructions (Signed)
Doxorubicin injection What is this medicine? DOXORUBICIN (dox oh ROO bi sin) is a chemotherapy drug. It is used to treat many kinds of cancer like Hodgkin's disease, leukemia, non-Hodgkin's lymphoma, neuroblastoma, sarcoma, and Wilms' tumor. It is also used to treat bladder cancer, breast cancer, lung cancer, ovarian cancer, stomach cancer, and thyroid cancer. This medicine may be used for other purposes; ask your health care provider or pharmacist if you have questions. What should I tell my health care provider before I take this medicine? They need to know if you have any of these conditions: -blood disorders -heart disease, recent heart attack -infection (especially a virus infection such as chickenpox, cold sores, or herpes) -irregular heartbeat -liver disease -recent or ongoing radiation therapy -an unusual or allergic reaction to doxorubicin, other chemotherapy agents, other medicines, foods, dyes, or preservatives -pregnant or trying to get pregnant -breast-feeding How should I use this medicine? This drug is given as an infusion into a vein. It is administered in a hospital or clinic by a specially trained health care professional. If you have pain, swelling, burning or any unusual feeling around the site of your injection, tell your health care professional right away. Talk to your pediatrician regarding the use of this medicine in children. Special care may be needed. Overdosage: If you think you have taken too much of this medicine contact a poison control center or emergency room at once. NOTE: This medicine is only for you. Do not share this medicine with others. What if I miss a dose? It is important not to miss your dose. Call your doctor or health care professional if you are unable to keep an appointment. What may interact with this medicine? Do not take this medicine with any of the following medications: -cisapride -droperidol -halofantrine -pimozide -zidovudine This  medicine may also interact with the following medications: -chloroquine -chlorpromazine -clarithromycin -cyclophosphamide -cyclosporine -erythromycin -medicines for depression, anxiety, or psychotic disturbances -medicines for irregular heart beat like amiodarone, bepridil, dofetilide, encainide, flecainide, propafenone, quinidine -medicines for seizures like ethotoin, fosphenytoin, phenytoin -medicines for nausea, vomiting like dolasetron, ondansetron, palonosetron -medicines to increase blood counts like filgrastim, pegfilgrastim, sargramostim -methadone -methotrexate -pentamidine -progesterone -vaccines -verapamil Talk to your doctor or health care professional before taking any of these medicines: -acetaminophen -aspirin -ibuprofen -ketoprofen -naproxen This list may not describe all possible interactions. Give your health care provider a list of all the medicines, herbs, non-prescription drugs, or dietary supplements you use. Also tell them if you smoke, drink alcohol, or use illegal drugs. Some items may interact with your medicine. What should I watch for while using this medicine? Your condition will be monitored carefully while you are receiving this medicine. You will need important blood work done while you are taking this medicine. This drug may make you feel generally unwell. This is not uncommon, as chemotherapy can affect healthy cells as well as cancer cells. Report any side effects. Continue your course of treatment even though you feel ill unless your doctor tells you to stop. Your urine may turn red for a few days after your dose. This is not blood. If your urine is dark or brown, call your doctor. In some cases, you may be given additional medicines to help with side effects. Follow all directions for their use. Call your doctor or health care professional for advice if you get a fever, chills or sore throat, or other symptoms of a cold or flu. Do not treat yourself.  This drug decreases your body's ability to   fight infections. Try to avoid being around people who are sick. This medicine may increase your risk to bruise or bleed. Call your doctor or health care professional if you notice any unusual bleeding. Be careful brushing and flossing your teeth or using a toothpick because you may get an infection or bleed more easily. If you have any dental work done, tell your dentist you are receiving this medicine. Avoid taking products that contain aspirin, acetaminophen, ibuprofen, naproxen, or ketoprofen unless instructed by your doctor. These medicines may hide a fever. Men and women of childbearing age should use effective birth control methods while using taking this medicine. Do not become pregnant while taking this medicine. There is a potential for serious side effects to an unborn child. Talk to your health care professional or pharmacist for more information. Do not breast-feed an infant while taking this medicine. Do not let others touch your urine or other body fluids for 5 days after each treatment with this medicine. Caregivers should wear latex gloves to avoid touching body fluids during this time. There is a maximum amount of this medicine you should receive throughout your life. The amount depends on the medical condition being treated and your overall health. Your doctor will watch how much of this medicine you receive in your lifetime. Tell your doctor if you have taken this medicine before. What side effects may I notice from receiving this medicine? Side effects that you should report to your doctor or health care professional as soon as possible: -allergic reactions like skin rash, itching or hives, swelling of the face, lips, or tongue -low blood counts - this medicine may decrease the number of white blood cells, red blood cells and platelets. You may be at increased risk for infections and bleeding. -signs of infection - fever or chills, cough,  sore throat, pain or difficulty passing urine -signs of decreased platelets or bleeding - bruising, pinpoint red spots on the skin, black, tarry stools, blood in the urine -signs of decreased red blood cells - unusually weak or tired, fainting spells, lightheadedness -breathing problems -chest pain -fast, irregular heartbeat -mouth sores -nausea, vomiting -pain, swelling, redness at site where injected -pain, tingling, numbness in the hands or feet -swelling of ankles, feet, or hands -unusual bleeding or bruising Side effects that usually do not require medical attention (report to your doctor or health care professional if they continue or are bothersome): -diarrhea -facial flushing -hair loss -loss of appetite -missed menstrual periods -nail discoloration or damage -red or watery eyes -red colored urine -stomach upset This list may not describe all possible side effects. Call your doctor for medical advice about side effects. You may report side effects to FDA at 1-800-FDA-1088. Where should I keep my medicine? This drug is given in a hospital or clinic and will not be stored at home. NOTE: This sheet is a summary. It may not cover all possible information. If you have questions about this medicine, talk to your doctor, pharmacist, or health care provider.    2016, [employer]. (2012-05-14 09:54:34) Cyclophosphamide injection What is this medicine? CYCLOPHOSPHAMIDE (sye kloe FOSS fa mide) is a chemotherapy drug. It slows the growth of cancer cells. This medicine is used to treat many types of cancer like lymphoma, myeloma, leukemia, breast cancer, and ovarian cancer, to name a few. This medicine may be used for other purposes; ask your health care provider or pharmacist if you have questions. What should I tell my health care provider before I take   this medicine? They need to know if you have any of these conditions: -blood disorders -history of other  chemotherapy -infection -kidney disease -liver disease -recent or ongoing radiation therapy -tumors in the bone marrow -an unusual or allergic reaction to cyclophosphamide, other chemotherapy, other medicines, foods, dyes, or preservatives -pregnant or trying to get pregnant -breast-feeding How should I use this medicine? This drug is usually given as an injection into a vein or muscle or by infusion into a vein. It is administered in a hospital or clinic by a specially trained health care professional. Talk to your pediatrician regarding the use of this medicine in children. Special care may be needed. Overdosage: If you think you have taken too much of this medicine contact a poison control center or emergency room at once. NOTE: This medicine is only for you. Do not share this medicine with others. What if I miss a dose? It is important not to miss your dose. Call your doctor or health care professional if you are unable to keep an appointment. What may interact with this medicine? This medicine may interact with the following medications: -amiodarone -amphotericin B -azathioprine -certain antiviral medicines for HIV or AIDS such as protease inhibitors (e.g., indinavir, ritonavir) and zidovudine -certain blood pressure medications such as benazepril, captopril, enalapril, fosinopril, lisinopril, moexipril, monopril, perindopril, quinapril, ramipril, trandolapril -certain cancer medications such as anthracyclines (e.g., daunorubicin, doxorubicin), busulfan, cytarabine, paclitaxel, pentostatin, tamoxifen, trastuzumab -certain diuretics such as chlorothiazide, chlorthalidone, hydrochlorothiazide, indapamide, metolazone -certain medicines that treat or prevent blood clots like warfarin -certain muscle relaxants such as succinylcholine -cyclosporine -etanercept -indomethacin -medicines to increase blood counts like filgrastim, pegfilgrastim, sargramostim -medicines used as general  anesthesia -metronidazole -natalizumab This list may not describe all possible interactions. Give your health care provider a list of all the medicines, herbs, non-prescription drugs, or dietary supplements you use. Also tell them if you smoke, drink alcohol, or use illegal drugs. Some items may interact with your medicine. What should I watch for while using this medicine? Visit your doctor for checks on your progress. This drug may make you feel generally unwell. This is not uncommon, as chemotherapy can affect healthy cells as well as cancer cells. Report any side effects. Continue your course of treatment even though you feel ill unless your doctor tells you to stop. Drink water or other fluids as directed. Urinate often, even at night. In some cases, you may be given additional medicines to help with side effects. Follow all directions for their use. Call your doctor or health care professional for advice if you get a fever, chills or sore throat, or other symptoms of a cold or flu. Do not treat yourself. This drug decreases your body's ability to fight infections. Try to avoid being around people who are sick. This medicine may increase your risk to bruise or bleed. Call your doctor or health care professional if you notice any unusual bleeding. Be careful brushing and flossing your teeth or using a toothpick because you may get an infection or bleed more easily. If you have any dental work done, tell your dentist you are receiving this medicine. You may get drowsy or dizzy. Do not drive, use machinery, or do anything that needs mental alertness until you know how this medicine affects you. Do not become pregnant while taking this medicine or for 1 year after stopping it. Women should inform their doctor if they wish to become pregnant or think they might be pregnant. Men should not father a child   while taking this medicine and for 4 months after stopping it. There is a potential for serious side  effects to an unborn child. Talk to your health care professional or pharmacist for more information. Do not breast-feed an infant while taking this medicine. This medicine may interfere with the ability to have a child. This medicine has caused ovarian failure in some women. This medicine has caused reduced sperm counts in some men. You should talk with your doctor or health care professional if you are concerned about your fertility. If you are going to have surgery, tell your doctor or health care professional that you have taken this medicine. What side effects may I notice from receiving this medicine? Side effects that you should report to your doctor or health care professional as soon as possible: -allergic reactions like skin rash, itching or hives, swelling of the face, lips, or tongue -low blood counts - this medicine may decrease the number of white blood cells, red blood cells and platelets. You may be at increased risk for infections and bleeding. -signs of infection - fever or chills, cough, sore throat, pain or difficulty passing urine -signs of decreased platelets or bleeding - bruising, pinpoint red spots on the skin, black, tarry stools, blood in the urine -signs of decreased red blood cells - unusually weak or tired, fainting spells, lightheadedness -breathing problems -dark urine -dizziness -palpitations -swelling of the ankles, feet, hands -trouble passing urine or change in the amount of urine -weight gain -yellowing of the eyes or skin Side effects that usually do not require medical attention (report to your doctor or health care professional if they continue or are bothersome): -changes in nail or skin color -hair loss -missed menstrual periods -mouth sores -nausea, vomiting This list may not describe all possible side effects. Call your doctor for medical advice about side effects. You may report side effects to FDA at 1-800-FDA-1088. Where should I keep my  medicine? This drug is given in a hospital or clinic and will not be stored at home. NOTE: This sheet is a summary. It may not cover all possible information. If you have questions about this medicine, talk to your doctor, pharmacist, or health care provider.    2016, [employer]. (2011-12-01 16:22:58) Paclitaxel injection What is this medicine? PACLITAXEL (PAK li TAX el) is a chemotherapy drug. It targets fast dividing cells, like cancer cells, and causes these cells to die. This medicine is used to treat ovarian cancer, breast cancer, and other cancers. This medicine may be used for other purposes; ask your health care provider or pharmacist if you have questions. What should I tell my health care provider before I take this medicine? They need to know if you have any of these conditions: -blood disorders -irregular heartbeat -infection (especially a virus infection such as chickenpox, cold sores, or herpes) -liver disease -previous or ongoing radiation therapy -an unusual or allergic reaction to paclitaxel, alcohol, polyoxyethylated castor oil, other chemotherapy agents, other medicines, foods, dyes, or preservatives -pregnant or trying to get pregnant -breast-feeding How should I use this medicine? This drug is given as an infusion into a vein. It is administered in a hospital or clinic by a specially trained health care professional. Talk to your pediatrician regarding the use of this medicine in children. Special care may be needed. Overdosage: If you think you have taken too much of this medicine contact a poison control center or emergency room at once. NOTE: This medicine is only for you. Do   not share this medicine with others. What if I miss a dose? It is important not to miss your dose. Call your doctor or health care professional if you are unable to keep an appointment. What may interact with this medicine? Do not take this medicine with any of the following  medications: -disulfiram -metronidazole This medicine may also interact with the following medications: -cyclosporine -diazepam -ketoconazole -medicines to increase blood counts like filgrastim, pegfilgrastim, sargramostim -other chemotherapy drugs like cisplatin, doxorubicin, epirubicin, etoposide, teniposide, vincristine -quinidine -testosterone -vaccines -verapamil Talk to your doctor or health care professional before taking any of these medicines: -acetaminophen -aspirin -ibuprofen -ketoprofen -naproxen This list may not describe all possible interactions. Give your health care provider a list of all the medicines, herbs, non-prescription drugs, or dietary supplements you use. Also tell them if you smoke, drink alcohol, or use illegal drugs. Some items may interact with your medicine. What should I watch for while using this medicine? Your condition will be monitored carefully while you are receiving this medicine. You will need important blood work done while you are taking this medicine. This drug may make you feel generally unwell. This is not uncommon, as chemotherapy can affect healthy cells as well as cancer cells. Report any side effects. Continue your course of treatment even though you feel ill unless your doctor tells you to stop. This medicine can cause serious allergic reactions. To reduce your risk you will need to take other medicine(s) before treatment with this medicine. In some cases, you may be given additional medicines to help with side effects. Follow all directions for their use. Call your doctor or health care professional for advice if you get a fever, chills or sore throat, or other symptoms of a cold or flu. Do not treat yourself. This drug decreases your body's ability to fight infections. Try to avoid being around people who are sick. This medicine may increase your risk to bruise or bleed. Call your doctor or health care professional if you notice any  unusual bleeding. Be careful brushing and flossing your teeth or using a toothpick because you may get an infection or bleed more easily. If you have any dental work done, tell your dentist you are receiving this medicine. Avoid taking products that contain aspirin, acetaminophen, ibuprofen, naproxen, or ketoprofen unless instructed by your doctor. These medicines may hide a fever. Do not become pregnant while taking this medicine. Women should inform their doctor if they wish to become pregnant or think they might be pregnant. There is a potential for serious side effects to an unborn child. Talk to your health care professional or pharmacist for more information. Do not breast-feed an infant while taking this medicine. Men are advised not to father a child while receiving this medicine. This product may contain alcohol. Ask your pharmacist or healthcare provider if this medicine contains alcohol. Be sure to tell all healthcare providers you are taking this medicine. Certain medicines, like metronidazole and disulfiram, can cause an unpleasant reaction when taken with alcohol. The reaction includes flushing, headache, nausea, vomiting, sweating, and increased thirst. The reaction can last from 30 minutes to several hours. What side effects may I notice from receiving this medicine? Side effects that you should report to your doctor or health care professional as soon as possible: -allergic reactions like skin rash, itching or hives, swelling of the face, lips, or tongue -low blood counts - This drug may decrease the number of white blood cells, red blood cells and platelets.   You may be at increased risk for infections and bleeding. -signs of infection - fever or chills, cough, sore throat, pain or difficulty passing urine -signs of decreased platelets or bleeding - bruising, pinpoint red spots on the skin, black, tarry stools, nosebleeds -signs of decreased red blood cells - unusually weak or tired,  fainting spells, lightheadedness -breathing problems -chest pain -high or low blood pressure -mouth sores -nausea and vomiting -pain, swelling, redness or irritation at the injection site -pain, tingling, numbness in the hands or feet -slow or irregular heartbeat -swelling of the ankle, feet, hands Side effects that usually do not require medical attention (report to your doctor or health care professional if they continue or are bothersome): -bone pain -complete hair loss including hair on your head, underarms, pubic hair, eyebrows, and eyelashes -changes in the color of fingernails -diarrhea -loosening of the fingernails -loss of appetite -muscle or joint pain -red flush to skin -sweating This list may not describe all possible side effects. Call your doctor for medical advice about side effects. You may report side effects to FDA at 1-800-FDA-1088. Where should I keep my medicine? This drug is given in a hospital or clinic and will not be stored at home. NOTE: This sheet is a summary. It may not cover all possible information. If you have questions about this medicine, talk to your doctor, pharmacist, or health care provider.    2016, [employer]. (2014-09-03 13:02:56)  

## 2015-03-02 NOTE — Transfer of Care (Signed)
Immediate Anesthesia Transfer of Care Note  Patient: Marisa Evans  Procedure(s) Performed: Procedure(s): INSERTION PORT-A-CATH (Right)  Patient Location: PACU  Anesthesia Type:General  Level of Consciousness: awake, alert  and oriented  Airway & Oxygen Therapy: Patient Spontanous Breathing  Post-op Assessment: Report given to RN and Post -op Vital signs reviewed and stable  Post vital signs: Reviewed and stable  Last Vitals:  Filed Vitals:   03/02/15 1432 03/02/15 1813  BP: 122/66 128/79  Pulse: 74 71  Temp: 36.5 C 36.6 C  Resp: 18 12    Complications: No apparent anesthesia complications

## 2015-03-02 NOTE — Discharge Instructions (Addendum)
Take Tylenol if needed for pain.  May shower and blot dry.  The glue will likely come off within one or 2 weeks.  Follow-up of the cancer center on Thursday.Follow-up with Dr. Tamala Julian after completion of chemotherapy    .AMBULATORY SURGERY  DISCHARGE INSTRUCTIONS   1) The drugs that you were given will stay in your system until tomorrow so for the next 24 hours you should not:  A) Drive an automobile B) Make any legal decisions C) Drink any alcoholic beverage   2) You may resume regular meals tomorrow.  Today it is better to start with liquids and gradually work up to solid foods.  You may eat anything you prefer, but it is better to start with liquids, then soup and crackers, and gradually work up to solid foods.   3) Please notify your doctor immediately if you have any unusual bleeding, trouble breathing, redness and pain at the surgery site, drainage, fever, or pain not relieved by medication. 4)   5) Your post-operative visit with Dr.                                     is: Date:                        Time:    Please call to schedule your post-operative visit.  6) Additional Instructions:

## 2015-03-02 NOTE — Anesthesia Preprocedure Evaluation (Signed)
Anesthesia Evaluation  Patient identified by MRN, date of birth, ID band Patient awake    Reviewed: Allergy & Precautions, H&P , NPO status , Patient's Chart, lab work & pertinent test results, reviewed documented beta blocker date and time   Airway Mallampati: II  TM Distance: >3 FB Neck ROM: full    Dental no notable dental hx. (+) Teeth Intact   Pulmonary neg pulmonary ROS,    Pulmonary exam normal breath sounds clear to auscultation       Cardiovascular Exercise Tolerance: Good hypertension, negative cardio ROS   Rhythm:regular Rate:Normal     Neuro/Psych  Headaches, negative neurological ROS  negative psych ROS   GI/Hepatic negative GI ROS, Neg liver ROS, GERD  ,  Endo/Other  negative endocrine ROSdiabetes  Renal/GU      Musculoskeletal   Abdominal   Peds  Hematology negative hematology ROS (+)   Anesthesia Other Findings   Reproductive/Obstetrics negative OB ROS                             Anesthesia Physical Anesthesia Plan  ASA: II  Anesthesia Plan: MAC   Post-op Pain Management:    Induction:   Airway Management Planned:   Additional Equipment:   Intra-op Plan:   Post-operative Plan:   Informed Consent: I have reviewed the patients History and Physical, chart, labs and discussed the procedure including the risks, benefits and alternatives for the proposed anesthesia with the patient or authorized representative who has indicated his/her understanding and acceptance.     Plan Discussed with: CRNA  Anesthesia Plan Comments:         Anesthesia Quick Evaluation

## 2015-03-03 ENCOUNTER — Telehealth: Payer: Self-pay

## 2015-03-03 ENCOUNTER — Encounter: Payer: Self-pay | Admitting: Surgery

## 2015-03-03 ENCOUNTER — Ambulatory Visit
Admission: RE | Admit: 2015-03-03 | Discharge: 2015-03-03 | Disposition: A | Discharge status: Home / Self Care | Payer: Managed Care, Other (non HMO) | Source: Ambulatory Visit | Attending: Oncology | Admitting: Oncology

## 2015-03-03 ENCOUNTER — Other Ambulatory Visit: Payer: Self-pay | Admitting: Oncology

## 2015-03-03 DIAGNOSIS — C50912 Malignant neoplasm of unspecified site of left female breast: Secondary | ICD-10-CM

## 2015-03-03 MED ORDER — GADOBENATE DIMEGLUMINE 529 MG/ML IV SOLN
14.0000 mL | Freq: Once | INTRAVENOUS | Status: AC | PRN
  Administered 2015-03-03: 14 mL via INTRAVENOUS

## 2015-03-03 NOTE — Telephone Encounter (Signed)
No clinical reason at all.  If she can self administer and insurance will cover her pharmacy co-pay, I'm perfectly fine with that.

## 2015-03-03 NOTE — Telephone Encounter (Signed)
Marisa Evans was calling to see if Dr. Grayland Ormond would be OK for patient to the administer her Neulasta injections at home.  They would like [place] to provide "clinical" reasons why this can not be done.  I called patient and she is not comfortable with self injecting.  Is there a "clinical" reason why patient's receive Neulast inj here that I can give the insurance company?

## 2015-03-03 NOTE — Anesthesia Postprocedure Evaluation (Signed)
Anesthesia Post Note  Patient: Marisa Evans  Procedure(s) Performed: Procedure(s) (LRB): INSERTION PORT-A-CATH (Right)  Patient location during evaluation: PACU Anesthesia Type: MAC Level of consciousness: awake and alert Pain management: pain level controlled Vital Signs Assessment: post-procedure vital signs reviewed and stable Respiratory status: spontaneous breathing, nonlabored ventilation, respiratory function stable and patient connected to nasal cannula oxygen Cardiovascular status: stable and blood pressure returned to baseline Anesthetic complications: no    Last Vitals:  Filed Vitals:   03/02/15 1904 03/02/15 1929  BP: 142/73 134/74  Pulse: 64 64  Temp: 36.1 C   Resp: 16 16    Last Pain:  Filed Vitals:   03/02/15 1930  PainSc: 0-No pain                 Molli Barrows

## 2015-03-04 ENCOUNTER — Inpatient Hospital Stay: Payer: Managed Care, Other (non HMO) | Attending: Oncology

## 2015-03-04 DIAGNOSIS — Z170 Estrogen receptor positive status [ER+]: Secondary | ICD-10-CM | POA: Insufficient documentation

## 2015-03-04 DIAGNOSIS — Z8669 Personal history of other diseases of the nervous system and sense organs: Secondary | ICD-10-CM | POA: Insufficient documentation

## 2015-03-04 DIAGNOSIS — M129 Arthropathy, unspecified: Secondary | ICD-10-CM | POA: Insufficient documentation

## 2015-03-04 DIAGNOSIS — F419 Anxiety disorder, unspecified: Secondary | ICD-10-CM | POA: Insufficient documentation

## 2015-03-04 DIAGNOSIS — Z5111 Encounter for antineoplastic chemotherapy: Secondary | ICD-10-CM | POA: Insufficient documentation

## 2015-03-04 DIAGNOSIS — K219 Gastro-esophageal reflux disease without esophagitis: Secondary | ICD-10-CM | POA: Insufficient documentation

## 2015-03-04 DIAGNOSIS — Z809 Family history of malignant neoplasm, unspecified: Secondary | ICD-10-CM | POA: Insufficient documentation

## 2015-03-04 DIAGNOSIS — C50412 Malignant neoplasm of upper-outer quadrant of left female breast: Secondary | ICD-10-CM | POA: Insufficient documentation

## 2015-03-04 DIAGNOSIS — Z79899 Other long term (current) drug therapy: Secondary | ICD-10-CM | POA: Insufficient documentation

## 2015-03-04 DIAGNOSIS — M818 Other osteoporosis without current pathological fracture: Secondary | ICD-10-CM | POA: Insufficient documentation

## 2015-03-04 DIAGNOSIS — R251 Tremor, unspecified: Secondary | ICD-10-CM | POA: Insufficient documentation

## 2015-03-04 NOTE — Telephone Encounter (Signed)
Left message informing patient is to receive injections in office.  Patient is not comfortable with self injecting.

## 2015-03-05 ENCOUNTER — Other Ambulatory Visit: Payer: Self-pay | Admitting: Oncology

## 2015-03-05 DIAGNOSIS — C50912 Malignant neoplasm of unspecified site of left female breast: Secondary | ICD-10-CM

## 2015-03-06 HISTORY — PX: BREAST BIOPSY: SHX20

## 2015-03-08 ENCOUNTER — Ambulatory Visit: Admission: RE | Admit: 2015-03-08 | Payer: Managed Care, Other (non HMO) | Source: Ambulatory Visit

## 2015-03-09 ENCOUNTER — Ambulatory Visit
Admission: RE | Admit: 2015-03-09 | Discharge: 2015-03-09 | Disposition: A | Discharge status: Home / Self Care | Payer: Managed Care, Other (non HMO) | Source: Ambulatory Visit | Attending: Oncology | Admitting: Oncology

## 2015-03-09 DIAGNOSIS — C50912 Malignant neoplasm of unspecified site of left female breast: Secondary | ICD-10-CM | POA: Diagnosis present

## 2015-03-09 NOTE — Progress Notes (Signed)
*  PRELIMINARY RESULTS* Echocardiogram 2D Echocardiogram has been performed.  Marisa Evans 03/09/2015, 10:59 AM

## 2015-03-11 ENCOUNTER — Inpatient Hospital Stay (HOSPITAL_BASED_OUTPATIENT_CLINIC_OR_DEPARTMENT_OTHER): Payer: Managed Care, Other (non HMO) | Admitting: Oncology

## 2015-03-11 ENCOUNTER — Inpatient Hospital Stay: Payer: Managed Care, Other (non HMO)

## 2015-03-11 ENCOUNTER — Other Ambulatory Visit: Payer: Self-pay | Admitting: Oncology

## 2015-03-11 ENCOUNTER — Other Ambulatory Visit: Payer: Self-pay | Admitting: *Deleted

## 2015-03-11 ENCOUNTER — Inpatient Hospital Stay (HOSPITAL_BASED_OUTPATIENT_CLINIC_OR_DEPARTMENT_OTHER): Payer: Managed Care, Other (non HMO)

## 2015-03-11 VITALS — BP 136/87 | HR 74 | Temp 97.3°F | Resp 16 | Wt 155.6 lb

## 2015-03-11 DIAGNOSIS — F419 Anxiety disorder, unspecified: Secondary | ICD-10-CM | POA: Diagnosis not present

## 2015-03-11 DIAGNOSIS — Z79899 Other long term (current) drug therapy: Secondary | ICD-10-CM

## 2015-03-11 DIAGNOSIS — Z5111 Encounter for antineoplastic chemotherapy: Secondary | ICD-10-CM | POA: Diagnosis not present

## 2015-03-11 DIAGNOSIS — Z809 Family history of malignant neoplasm, unspecified: Secondary | ICD-10-CM

## 2015-03-11 DIAGNOSIS — C50912 Malignant neoplasm of unspecified site of left female breast: Secondary | ICD-10-CM

## 2015-03-11 DIAGNOSIS — M129 Arthropathy, unspecified: Secondary | ICD-10-CM | POA: Diagnosis not present

## 2015-03-11 DIAGNOSIS — C50412 Malignant neoplasm of upper-outer quadrant of left female breast: Secondary | ICD-10-CM | POA: Diagnosis not present

## 2015-03-11 DIAGNOSIS — Z170 Estrogen receptor positive status [ER+]: Secondary | ICD-10-CM | POA: Diagnosis not present

## 2015-03-11 DIAGNOSIS — M818 Other osteoporosis without current pathological fracture: Secondary | ICD-10-CM | POA: Diagnosis not present

## 2015-03-11 DIAGNOSIS — R251 Tremor, unspecified: Secondary | ICD-10-CM | POA: Diagnosis not present

## 2015-03-11 DIAGNOSIS — Z8669 Personal history of other diseases of the nervous system and sense organs: Secondary | ICD-10-CM | POA: Diagnosis not present

## 2015-03-11 DIAGNOSIS — R920 Mammographic microcalcification found on diagnostic imaging of breast: Secondary | ICD-10-CM

## 2015-03-11 DIAGNOSIS — K219 Gastro-esophageal reflux disease without esophagitis: Secondary | ICD-10-CM | POA: Diagnosis not present

## 2015-03-11 LAB — CBC WITH DIFFERENTIAL/PLATELET
Basophils Absolute: 0.1 10*3/uL (ref 0–0.1)
Basophils Relative: 1 %
Eosinophils Absolute: 0.1 10*3/uL (ref 0–0.7)
Eosinophils Relative: 2 %
HEMATOCRIT: 36.6 % (ref 35.0–47.0)
Hemoglobin: 12.6 g/dL (ref 12.0–16.0)
LYMPHS ABS: 1.4 10*3/uL (ref 1.0–3.6)
LYMPHS PCT: 24 %
MCH: 31.9 pg (ref 26.0–34.0)
MCHC: 34.5 g/dL (ref 32.0–36.0)
MCV: 92.3 fL (ref 80.0–100.0)
MONO ABS: 0.9 10*3/uL (ref 0.2–0.9)
Monocytes Relative: 16 %
NEUTROS ABS: 3.5 10*3/uL (ref 1.4–6.5)
Neutrophils Relative %: 57 %
Platelets: 253 10*3/uL (ref 150–440)
RBC: 3.96 MIL/uL (ref 3.80–5.20)
RDW: 12.6 % (ref 11.5–14.5)
WBC: 6 10*3/uL (ref 3.6–11.0)

## 2015-03-11 LAB — COMPREHENSIVE METABOLIC PANEL
ALT: 25 U/L (ref 14–54)
AST: 27 U/L (ref 15–41)
Albumin: 3.9 g/dL (ref 3.5–5.0)
Alkaline Phosphatase: 56 U/L (ref 38–126)
Anion gap: 7 (ref 5–15)
BILIRUBIN TOTAL: 0.4 mg/dL (ref 0.3–1.2)
BUN: 21 mg/dL — AB (ref 6–20)
CO2: 25 mmol/L (ref 22–32)
CREATININE: 0.66 mg/dL (ref 0.44–1.00)
Calcium: 9 mg/dL (ref 8.9–10.3)
Chloride: 105 mmol/L (ref 101–111)
GFR calc Af Amer: >60 mL/min (ref >60)
Glucose, Bld: 86 mg/dL (ref 65–99)
POTASSIUM: 4.5 mmol/L (ref 3.5–5.1)
Sodium: 137 mmol/L (ref 135–145)
TOTAL PROTEIN: 6.7 g/dL (ref 6.5–8.1)

## 2015-03-11 MED ORDER — HEPARIN SOD (PORK) LOCK FLUSH 100 UNIT/ML IV SOLN
500.0000 [IU] | Freq: Once | INTRAVENOUS | Status: AC
  Administered 2015-03-11: 500 [IU] via INTRAVENOUS

## 2015-03-11 MED ORDER — HEPARIN SOD (PORK) LOCK FLUSH 100 UNIT/ML IV SOLN
INTRAVENOUS | Status: AC
  Filled 2015-03-11: qty 5

## 2015-03-11 NOTE — Progress Notes (Signed)
Johney Maine, RN  P Ccar Chemotherapy Rn Pool Cc: P Rx [facility] Technicians; P Rx [facility] Pharmacists           Patient is not getting treatment today.

## 2015-03-16 ENCOUNTER — Ambulatory Visit
Admission: RE | Admit: 2015-03-16 | Discharge: 2015-03-16 | Disposition: A | Discharge status: Home / Self Care | Payer: Managed Care, Other (non HMO) | Source: Ambulatory Visit | Attending: Oncology | Admitting: Oncology

## 2015-03-16 DIAGNOSIS — R920 Mammographic microcalcification found on diagnostic imaging of breast: Secondary | ICD-10-CM | POA: Insufficient documentation

## 2015-03-16 DIAGNOSIS — R921 Mammographic calcification found on diagnostic imaging of breast: Secondary | ICD-10-CM | POA: Diagnosis present

## 2015-03-16 DIAGNOSIS — D0502 Lobular carcinoma in situ of left breast: Secondary | ICD-10-CM | POA: Diagnosis not present

## 2015-03-16 NOTE — Progress Notes (Signed)
[facility]  [phone] [phone]) [phone]  ID: Marisa Evans OB: 02-Jun-1955  MR#: [id number]  [id number]  Patient Care Team: Adin Hector, MD as PCP - General (Internal Medicine)  CHIEF COMPLAINT:  Chief Complaint  Patient presents with  . Breast Cancer    INTERVAL HISTORY: Patient returns to clinic today for further evaluation and discussion of her imaging results. She is anxious, but otherwise feels well. She currently feels well and is asymptomatic. She has no neurologic complaints. She denies any recent fevers. She has a good appetite and denies weight loss. She has no chest pain or shortness of breath. She denies any nausea, vomiting, constipation, or diarrhea. She has no urinary complaints. Patient offers no specific complaints today.  REVIEW OF SYSTEMS:   Review of Systems  Constitutional: Negative for fever, weight loss and malaise/fatigue.  Respiratory: Negative.   Cardiovascular: Negative.   Gastrointestinal: Negative.   Musculoskeletal: Negative.   Neurological: Negative.  Negative for weakness.  Psychiatric/Behavioral: The patient is nervous/anxious.     As per HPI. Otherwise, a complete review of systems is negatve.  PAST MEDICAL HISTORY: Past Medical History  Diagnosis Date  . Anxiety   . Migraine   . H/O tubal ligation   . Osteoporosis   . Arthritis   . H/O breast biopsy   . GERD (gastroesophageal reflux disease)     NO MEDS  . Cancer ([place])   . Family history of adverse reaction to anesthesia     PTS UNCLE   . Tremor     TAKES PROPRANOLOL FOR TREMORS    PAST SURGICAL HISTORY: Past Surgical History  Procedure Laterality Date  . Wrist fracture surgery Right 2014  . Breast biopsy Bilateral 1996    neg  . Breast biopsy Left 2017  . Portacath placement Right 03/02/2015    Procedure: INSERTION PORT-A-CATH;  Surgeon: Leonie Green, MD;  Location: [facility] ORS;  Service: General;  Laterality: Right;     FAMILY HISTORY Family History  Problem Relation Age of Onset  . Hypertension Father   . Cancer Maternal Uncle   . Cancer Paternal Uncle   . Liver disease Paternal Grandfather        ADVANCED DIRECTIVES:    HEALTH MAINTENANCE: Social History  Substance Use Topics  . Smoking status: Never Smoker   . Smokeless tobacco: Not on file  . Alcohol Use: No     Colonoscopy:  PAP:  Bone density:  Lipid panel:  Allergies  Allergen Reactions  . Codeine Nausea Only  . Topamax [Topiramate] Other (See Comments)    Lightheaded, feels "out of body"    Current Outpatient Prescriptions  Medication Sig Dispense Refill  . alendronate (FOSAMAX) 70 MG tablet TAKE 1 TABLET BY MOUTH EVERY WEEK    . calcium-vitamin D 250-100 MG-UNIT tablet Take 2 tablets by mouth 2 (two) times daily. Reported on 02/25/2015    . Cholecalciferol (D 1000) 1000 units capsule Take 1,000 Units by mouth daily.    . Glucosamine Sulfate 500 MG TABS Take by mouth. Reported on 02/25/2015    . lidocaine-prilocaine (EMLA) cream Apply 1 application topically as needed. Apply to port then cover with saran wrap, 1-2 hours before chemotherapy appointment 30 g 2  . lidocaine-prilocaine (EMLA) cream Apply to affected area once 30 g 3  . Misc Natural Products (OSTEO BI-FLEX ADV DOUBLE ST) TABS Take 1 tablet by mouth daily.    . ondansetron (ZOFRAN) 8 MG tablet Take 1 tablet (  8 mg total) by mouth 2 (two) times daily as needed. Start on the third day after chemotherapy. 30 tablet 1  . prochlorperazine (COMPAZINE) 10 MG tablet Take 1 tablet (10 mg total) by mouth every 6 (six) hours as needed (Nausea or vomiting). 30 tablet 1  . propranolol (INDERAL) 10 MG tablet Take 10 mg by mouth 2 (two) times daily.    Marland Kitchen venlafaxine XR (EFFEXOR-XR) 150 MG 24 hr capsule TAKE ONE CAPSULE BY MOUTH EVERY DAY-PM     No current facility-administered medications for this visit.    OBJECTIVE: Filed Vitals:   03/11/15 1023  BP: 136/87  Pulse: 74   Temp: 97.3 F (36.3 C)  Resp: 16     Body mass index is 26.7 kg/(m^2).    ECOG FS:0 - Asymptomatic  General: Well-developed, well-nourished, no acute distress. Eyes: Pink conjunctiva, anicteric sclera. Breasts: Patient requested exam be deferred today. Lungs: Clear to auscultation bilaterally. Heart: Regular rate and rhythm. No rubs, murmurs, or gallops. Abdomen: Soft, nontender, nondistended. No organomegaly noted, normoactive bowel sounds. Musculoskeletal: No edema, cyanosis, or clubbing. Neuro: Alert, answering all questions appropriately. Cranial nerves grossly intact. Skin: No rashes or petechiae noted. Psych: Normal affect.  LAB RESULTS:  Lab Results  Component Value Date   NA 137 03/11/2015   K 4.5 03/11/2015   CL 105 03/11/2015   CO2 25 03/11/2015   GLUCOSE 86 03/11/2015   BUN 21* 03/11/2015   CREATININE 0.66 03/11/2015   CALCIUM 9.0 03/11/2015   PROT 6.7 03/11/2015   ALBUMIN 3.9 03/11/2015   AST 27 03/11/2015   ALT 25 03/11/2015   ALKPHOS 56 03/11/2015   BILITOT 0.4 03/11/2015   GFRNONAA >60 03/11/2015   GFRAA >60 03/11/2015    Lab Results  Component Value Date   WBC 6.0 03/11/2015   NEUTROABS 3.5 03/11/2015   HGB 12.6 03/11/2015   HCT 36.6 03/11/2015   MCV 92.3 03/11/2015   PLT 253 03/11/2015     STUDIES: Mr Breast Bilateral W Wo Contrast  03/03/2015  CLINICAL DATA:  60 year old female with biopsy proven invasive lobular carcinoma in the 11 o'clock region of the left breast. LABS:  None obtained at the time of imaging. EXAM: BILATERAL BREAST MRI WITH AND WITHOUT CONTRAST TECHNIQUE: Multiplanar, multisequence MR images of both breasts were obtained prior to and following the intravenous administration of 14 ml of MultiHance. THREE-DIMENSIONAL MR IMAGE RENDERING ON INDEPENDENT WORKSTATION: Three-dimensional MR images were rendered by post-processing of the original MR data on an independent workstation. The three-dimensional MR images were interpreted, and  findings are reported in the following complete MRI report for this study. Three dimensional images were evaluated at the independent Dyn[employer] workstation COMPARISON:  Previous exam(s). FINDINGS: Breast composition: c. Heterogeneous fibroglandular tissue. Background parenchymal enhancement: Moderate. Right breast: No mass or abnormal enhancement. Left breast: There is a 1.9 x 1.1 x 1.4 cm irregular enhancing mass in the upper inner quadrant of the left breast. A signal void artifact is seen in the mass corresponding with the biopsy proven invasive lobular carcinoma. 1.3 cm posterior lateral to the mass is 1.4 x 1.4 x 1.3 cm asymmetric non mass like enhancement. It is located in the upper outer quadrant. Lymph nodes: No abnormal appearing lymph nodes. Ancillary findings:  None. IMPRESSION: 1. 1.9 cm irregular enhancing mass in the upper inner quadrant of the left breast corresponding with the biopsy proven invasive lobular carcinoma. 2. Suspicious 1.4 cm of asymmetric non masslike enhancement in the upper outer quadrant of  the left breast. MR guided core biopsy is recommended. 3. Upon reviewing the prior mammograms coarse heterogeneous calcifications are seen in the upper outer quadrant of the left breast. The calcifications were magnified in the lateral projection. Recommend the patient return for magnification CC view as well as stereotactic biopsy of the calcifications. RECOMMENDATION: 1. Treatment planning of the known invasive lobular carcinoma in the upper-inner quadrant of the left breast is recommended. 2. MRI guided core biopsy of the non masslike enhancement in the upper outer quadrant of the left breast is recommended. 3. Magnification view in the CC projection of calcifications in the upper-outer quadrant of the left breast is recommended. Following the magnification view I recommend stereotactic biopsy of the calcifications given their coarse heterogeneous appearance. BI-RADS CATEGORY  4: Suspicious.  Electronically Signed   By: Lillia Mountain M.D.   On: 03/03/2015 15:57   Dg Chest Port 1 View  03/02/2015  CLINICAL DATA:  Port-A-Cath placement. EXAM: CHEST 1 VIEW COMPARISON:  None. FINDINGS: The right IJ power port tip is in the mid distal SVC. No complicating features. The heart is normal in size. Mild tortuosity of the thoracic aorta. The lungs are clear. No pleural effusion. The bony thorax is intact. IMPRESSION: Right IJ Port-A-Cath in good position without complicating features. Electronically Signed   By: Marijo Sanes M.D.   On: 03/02/2015 19:12   Dg C-arm 1-60 Min-no Report  03/02/2015  CLINICAL DATA: port placement C-ARM 1-60 MINUTES Fluoroscopy was utilized by the requesting physician.  No radiographic interpretation.    ASSESSMENT: Clinical stage IIa ER/PR+, invasive lobular carcinoma of the left breast.  PLAN:    1. Breast cancer:  MRI results reviewed independently and reported as above. After lengthy discussion with radiology, it was recommended to do a stereotactic biopsy of the lesion in the left upper outer quadrant approximately 5 cm away from the initial biopsy site. If this is malignant, can proceed with chemotherapy as planned. If this is benign, will then recommend an MR guided biopsy of the non-mass enhancement in the upper outer quadrant of the left breast. Patient was initially scheduled for chemotherapy with Adriamycin and Cytoxan today, but will delay treatment until biopsies are completed. Return to clinic in 1-2 weeks to initiate cycle 1 of 4 of dose dense Adriamycin and Cytoxan. This will be followed by 12 weeks of Taxol. She may or may not benefit from radiation therapy depending on the type of surgery she chooses. Ultimately, patient will require aromatase inhibitor for a minimum of 5 years. MUGA was denied by insurance, for cardiac echo revealed an EF between 55 and 60%.   Approximately 30 minutes was spent in discussion of which greater than 50% was  consultation.  Patient expressed understanding and was in agreement with this plan. She also understands that She can call clinic at any time with any questions, concerns, or complaints.   Breast cancer [facility])   Staging form: Breast, AJCC 7th Edition     Clinical stage from 02/12/2015: Stage IIA (T2, N0, M0) - Signed by Lloyd Huger, MD on 02/12/2015   Lloyd Huger, MD   03/16/2015 1:34 PM

## 2015-03-18 ENCOUNTER — Other Ambulatory Visit: Payer: Self-pay | Admitting: Oncology

## 2015-03-18 LAB — SURGICAL PATHOLOGY

## 2015-03-18 NOTE — Progress Notes (Signed)
Dr. Grayland Ormond requested patient be scheduled for chemotherapy that was postponed from last week. Post second biopsy, notified patient of upcoming chemotherapy appointment 03/23/15 at 0915.  She will have labs, and see MD prior to infusion.    Oncology Nurse Navigator Documentation  Navigator Location: [facility] (03/18/15 1600) Navigator Encounter Type: Telephone (03/18/15 1600)                                Acuity: Level 2 (03/18/15 1600)   Acuity Level 2: Initial guidance, education and coordination as needed;Assistance expediting appointments;Ongoing guidance and education throughout treatment as needed (03/18/15 1600)     Time Spent with Patient: 15 (03/18/15 1600)

## 2015-03-23 ENCOUNTER — Inpatient Hospital Stay: Payer: Managed Care, Other (non HMO)

## 2015-03-23 ENCOUNTER — Inpatient Hospital Stay (HOSPITAL_BASED_OUTPATIENT_CLINIC_OR_DEPARTMENT_OTHER): Payer: Managed Care, Other (non HMO) | Admitting: Oncology

## 2015-03-23 VITALS — BP 131/84 | HR 75 | Temp 97.1°F | Resp 16 | Wt 155.9 lb

## 2015-03-23 DIAGNOSIS — C50412 Malignant neoplasm of upper-outer quadrant of left female breast: Secondary | ICD-10-CM

## 2015-03-23 DIAGNOSIS — K219 Gastro-esophageal reflux disease without esophagitis: Secondary | ICD-10-CM

## 2015-03-23 DIAGNOSIS — M818 Other osteoporosis without current pathological fracture: Secondary | ICD-10-CM | POA: Diagnosis not present

## 2015-03-23 DIAGNOSIS — Z170 Estrogen receptor positive status [ER+]: Secondary | ICD-10-CM

## 2015-03-23 DIAGNOSIS — C50912 Malignant neoplasm of unspecified site of left female breast: Secondary | ICD-10-CM

## 2015-03-23 DIAGNOSIS — M129 Arthropathy, unspecified: Secondary | ICD-10-CM

## 2015-03-23 DIAGNOSIS — Z809 Family history of malignant neoplasm, unspecified: Secondary | ICD-10-CM

## 2015-03-23 DIAGNOSIS — R251 Tremor, unspecified: Secondary | ICD-10-CM

## 2015-03-23 DIAGNOSIS — F419 Anxiety disorder, unspecified: Secondary | ICD-10-CM | POA: Diagnosis not present

## 2015-03-23 DIAGNOSIS — Z79899 Other long term (current) drug therapy: Secondary | ICD-10-CM

## 2015-03-23 DIAGNOSIS — Z8669 Personal history of other diseases of the nervous system and sense organs: Secondary | ICD-10-CM

## 2015-03-23 LAB — COMPREHENSIVE METABOLIC PANEL
ALK PHOS: 51 U/L (ref 38–126)
ALT: 21 U/L (ref 14–54)
ANION GAP: 3 — AB (ref 5–15)
AST: 22 U/L (ref 15–41)
Albumin: 4 g/dL (ref 3.5–5.0)
BUN: 18 mg/dL (ref 6–20)
CO2: 26 mmol/L (ref 22–32)
Calcium: 8.9 mg/dL (ref 8.9–10.3)
Chloride: 103 mmol/L (ref 101–111)
Creatinine, Ser: 0.64 mg/dL (ref 0.44–1.00)
GFR calc non Af Amer: >60 mL/min (ref >60)
Glucose, Bld: 83 mg/dL (ref 65–99)
Potassium: 4.4 mmol/L (ref 3.5–5.1)
SODIUM: 132 mmol/L — AB (ref 135–145)
Total Bilirubin: 0.4 mg/dL (ref 0.3–1.2)
Total Protein: 6.7 g/dL (ref 6.5–8.1)

## 2015-03-23 LAB — CBC WITH DIFFERENTIAL/PLATELET
Basophils Absolute: 0.1 10*3/uL (ref 0–0.1)
Basophils Relative: 2 %
EOS ABS: 0.1 10*3/uL (ref 0–0.7)
EOS PCT: 2 %
HCT: 37 % (ref 35.0–47.0)
HEMOGLOBIN: 12.8 g/dL (ref 12.0–16.0)
LYMPHS ABS: 1.6 10*3/uL (ref 1.0–3.6)
Lymphocytes Relative: 31 %
MCH: 31.9 pg (ref 26.0–34.0)
MCHC: 34.5 g/dL (ref 32.0–36.0)
MCV: 92.7 fL (ref 80.0–100.0)
MONO ABS: 0.7 10*3/uL (ref 0.2–0.9)
MONOS PCT: 14 %
Neutro Abs: 2.7 10*3/uL (ref 1.4–6.5)
Neutrophils Relative %: 51 %
PLATELETS: 278 10*3/uL (ref 150–440)
RBC: 3.99 MIL/uL (ref 3.80–5.20)
RDW: 12.9 % (ref 11.5–14.5)
WBC: 5.2 10*3/uL (ref 3.6–11.0)

## 2015-03-23 MED ORDER — SODIUM CHLORIDE 0.9 % IV SOLN
Freq: Once | INTRAVENOUS | Status: AC
  Administered 2015-03-23: 11:00:00 via INTRAVENOUS
  Filled 2015-03-23: qty 1000

## 2015-03-23 MED ORDER — PALONOSETRON HCL INJECTION 0.25 MG/5ML
0.2500 mg | Freq: Once | INTRAVENOUS | Status: AC
  Administered 2015-03-23: 0.25 mg via INTRAVENOUS
  Filled 2015-03-23: qty 5

## 2015-03-23 MED ORDER — HEPARIN SOD (PORK) LOCK FLUSH 100 UNIT/ML IV SOLN
500.0000 [IU] | Freq: Once | INTRAVENOUS | Status: AC
  Administered 2015-03-23: 500 [IU] via INTRAVENOUS
  Filled 2015-03-23: qty 5

## 2015-03-23 MED ORDER — FOSAPREPITANT DIMEGLUMINE INJECTION 150 MG
Freq: Once | INTRAVENOUS | Status: AC
  Administered 2015-03-23: 11:00:00 via INTRAVENOUS
  Filled 2015-03-23: qty 5

## 2015-03-23 MED ORDER — CYCLOPHOSPHAMIDE CHEMO INJECTION 1 GM
1000.0000 mg | Freq: Once | INTRAMUSCULAR | Status: AC
  Administered 2015-03-23: 1000 mg via INTRAVENOUS
  Filled 2015-03-23: qty 50

## 2015-03-23 MED ORDER — DOXORUBICIN HCL CHEMO IV INJECTION 2 MG/ML
60.0000 mg/m2 | Freq: Once | INTRAVENOUS | Status: AC
  Administered 2015-03-23: 108 mg via INTRAVENOUS
  Filled 2015-03-23: qty 54

## 2015-03-23 MED ORDER — PEGFILGRASTIM 6 MG/0.6ML ~~LOC~~ PSKT
6.0000 mg | PREFILLED_SYRINGE | Freq: Once | SUBCUTANEOUS | Status: AC
  Administered 2015-03-23: 6 mg via SUBCUTANEOUS
  Filled 2015-03-23: qty 0.6

## 2015-03-23 MED ORDER — SODIUM CHLORIDE 0.9% FLUSH
10.0000 mL | INTRAVENOUS | Status: DC | PRN
  Filled 2015-03-23: qty 10

## 2015-03-23 NOTE — Progress Notes (Signed)
Note from MD on 2/14   MUGA was denied by insurance, for cardiac echo revealed an EF between 55 and 60%.    Lloyd Huger, MD 03/16/2015 1:34 PM

## 2015-03-28 NOTE — Progress Notes (Signed)
Marisa Evans  Telephone:(336) [phone] Fax:(336) [phone]  ID: Marisa Evans OB: 03/23/1955  MR#: [id number]  [id number]  Patient Care Team: Adin Hector, MD as PCP - General (Internal Medicine)  CHIEF COMPLAINT:  Chief Complaint  Patient presents with  . Breast Cancer    INTERVAL HISTORY: Patient returns to clinic today for further evaluation and initiation of cycle 1 of 4 of Adriamycin and Cytoxan. She currently feels well and is asymptomatic. She has no neurologic complaints. She denies any recent fevers. She has a good appetite and denies weight loss. She has no chest pain or shortness of breath. She denies any nausea, vomiting, constipation, or diarrhea. She has no urinary complaints. Patient offers no specific complaints today.  REVIEW OF SYSTEMS:   Review of Systems  Constitutional: Negative for fever, weight loss and malaise/fatigue.  Respiratory: Negative.   Cardiovascular: Negative.   Gastrointestinal: Negative.   Musculoskeletal: Negative.   Neurological: Negative.  Negative for weakness.  Psychiatric/Behavioral: The patient is nervous/anxious.     As per HPI. Otherwise, a complete review of systems is negatve.  PAST MEDICAL HISTORY: Past Medical History  Diagnosis Date  . Anxiety   . Migraine   . H/O tubal ligation   . Osteoporosis   . Arthritis   . H/O breast biopsy   . GERD (gastroesophageal reflux disease)     NO MEDS  . Cancer ([place])   . Family history of adverse reaction to anesthesia     PTS UNCLE   . Tremor     TAKES PROPRANOLOL FOR TREMORS    PAST SURGICAL HISTORY: Past Surgical History  Procedure Laterality Date  . Wrist fracture surgery Right 2014  . Breast biopsy Bilateral 1996    neg  . Breast biopsy Left 2017  . Portacath placement Right 03/02/2015    Procedure: INSERTION PORT-A-CATH;  Surgeon: Leonie Green, MD;  Location: [facility] ORS;  Service: General;  Laterality: Right;    FAMILY HISTORY Family  History  Problem Relation Age of Onset  . Hypertension Father   . Cancer Maternal Uncle   . Cancer Paternal Uncle   . Liver disease Paternal Grandfather        ADVANCED DIRECTIVES:    HEALTH MAINTENANCE: Social History  Substance Use Topics  . Smoking status: Never Smoker   . Smokeless tobacco: Not on file  . Alcohol Use: No     Colonoscopy:  PAP:  Bone density:  Lipid panel:  Allergies  Allergen Reactions  . Codeine Nausea Only  . Topamax [Topiramate] Other (See Comments)    Lightheaded, feels "out of body"    Current Outpatient Prescriptions  Medication Sig Dispense Refill  . alendronate (FOSAMAX) 70 MG tablet TAKE 1 TABLET BY MOUTH EVERY WEEK    . calcium-vitamin D 250-100 MG-UNIT tablet Take 2 tablets by mouth 2 (two) times daily. Reported on 02/25/2015    . Cholecalciferol (D 1000) 1000 units capsule Take 1,000 Units by mouth daily.    . Glucosamine Sulfate 500 MG TABS Take by mouth. Reported on 02/25/2015    . lidocaine-prilocaine (EMLA) cream Apply 1 application topically as needed. Apply to port then cover with saran wrap, 1-2 hours before chemotherapy appointment 30 g 2  . Misc Natural Products (OSTEO BI-FLEX ADV DOUBLE ST) TABS Take 1 tablet by mouth daily.    . ondansetron (ZOFRAN) 8 MG tablet Take 1 tablet (8 mg total) by mouth 2 (two) times daily as needed. Start on the third  day after chemotherapy. 30 tablet 1  . prochlorperazine (COMPAZINE) 10 MG tablet Take 1 tablet (10 mg total) by mouth every 6 (six) hours as needed (Nausea or vomiting). 30 tablet 1  . propranolol (INDERAL) 10 MG tablet Take 10 mg by mouth 2 (two) times daily.    Marland Kitchen venlafaxine XR (EFFEXOR-XR) 150 MG 24 hr capsule TAKE ONE CAPSULE BY MOUTH EVERY DAY-PM     No current facility-administered medications for this visit.    OBJECTIVE: Filed Vitals:   03/23/15 0942  BP: 131/84  Pulse: 75  Temp: 97.1 F (36.2 C)  Resp: 16     Body mass index is 26.74 kg/(m^2).    ECOG FS:0 -  Asymptomatic  General: Well-developed, well-nourished, no acute distress. Eyes: Pink conjunctiva, anicteric sclera. Breasts: Patient requested exam be deferred today. Lungs: Clear to auscultation bilaterally. Heart: Regular rate and rhythm. No rubs, murmurs, or gallops. Abdomen: Soft, nontender, nondistended. No organomegaly noted, normoactive bowel sounds. Musculoskeletal: No edema, cyanosis, or clubbing. Neuro: Alert, answering all questions appropriately. Cranial nerves grossly intact. Skin: No rashes or petechiae noted. Psych: Normal affect.  LAB RESULTS:  Lab Results  Component Value Date   NA 132* 03/23/2015   K 4.4 03/23/2015   CL 103 03/23/2015   CO2 26 03/23/2015   GLUCOSE 83 03/23/2015   BUN 18 03/23/2015   CREATININE 0.64 03/23/2015   CALCIUM 8.9 03/23/2015   PROT 6.7 03/23/2015   ALBUMIN 4.0 03/23/2015   AST 22 03/23/2015   ALT 21 03/23/2015   ALKPHOS 51 03/23/2015   BILITOT 0.4 03/23/2015   GFRNONAA >60 03/23/2015   GFRAA >60 03/23/2015    Lab Results  Component Value Date   WBC 5.2 03/23/2015   NEUTROABS 2.7 03/23/2015   HGB 12.8 03/23/2015   HCT 37.0 03/23/2015   MCV 92.7 03/23/2015   PLT 278 03/23/2015     STUDIES: Mr Breast Bilateral W Wo Contrast  03/03/2015  CLINICAL DATA:  60 year old female with biopsy proven invasive lobular carcinoma in the 11 o'clock region of the left breast. LABS:  None obtained at the time of imaging. EXAM: BILATERAL BREAST MRI WITH AND WITHOUT CONTRAST TECHNIQUE: Multiplanar, multisequence MR images of both breasts were obtained prior to and following the intravenous administration of 14 ml of MultiHance. THREE-DIMENSIONAL MR IMAGE RENDERING ON INDEPENDENT WORKSTATION: Three-dimensional MR images were rendered by post-processing of the original MR data on an independent workstation. The three-dimensional MR images were interpreted, and findings are reported in the following complete MRI report for this study. Three  dimensional images were evaluated at the independent DynaCad workstation COMPARISON:  Previous exam(s). FINDINGS: Breast composition: c. Heterogeneous fibroglandular tissue. Background parenchymal enhancement: Moderate. Right breast: No mass or abnormal enhancement. Left breast: There is a 1.9 x 1.1 x 1.4 cm irregular enhancing mass in the upper inner quadrant of the left breast. A signal void artifact is seen in the mass corresponding with the biopsy proven invasive lobular carcinoma. 1.3 cm posterior lateral to the mass is 1.4 x 1.4 x 1.3 cm asymmetric non mass like enhancement. It is located in the upper outer quadrant. Lymph nodes: No abnormal appearing lymph nodes. Ancillary findings:  None. IMPRESSION: 1. 1.9 cm irregular enhancing mass in the upper inner quadrant of the left breast corresponding with the biopsy proven invasive lobular carcinoma. 2. Suspicious 1.4 cm of asymmetric non masslike enhancement in the upper outer quadrant of the left breast. MR guided core biopsy is recommended. 3. Upon reviewing the prior mammograms  coarse heterogeneous calcifications are seen in the upper outer quadrant of the left breast. The calcifications were magnified in the lateral projection. Recommend the patient return for magnification CC view as well as stereotactic biopsy of the calcifications. RECOMMENDATION: 1. Treatment planning of the known invasive lobular carcinoma in the upper-inner quadrant of the left breast is recommended. 2. MRI guided core biopsy of the non masslike enhancement in the upper outer quadrant of the left breast is recommended. 3. Magnification view in the CC projection of calcifications in the upper-outer quadrant of the left breast is recommended. Following the magnification view I recommend stereotactic biopsy of the calcifications given their coarse heterogeneous appearance. BI-RADS CATEGORY  4: Suspicious. Electronically Signed   By: Lillia Mountain M.D.   On: 03/03/2015 15:57   Dg Chest Port  1 View  03/02/2015  CLINICAL DATA:  Port-A-Cath placement. EXAM: CHEST 1 VIEW COMPARISON:  None. FINDINGS: The right IJ power port tip is in the mid distal SVC. No complicating features. The heart is normal in size. Mild tortuosity of the thoracic aorta. The lungs are clear. No pleural effusion. The bony thorax is intact. IMPRESSION: Right IJ Port-A-Cath in good position without complicating features. Electronically Signed   By: Marijo Sanes M.D.   On: 03/02/2015 19:12   Mm Digital Diagnostic Unilat L  03/16/2015  CLINICAL DATA:  Post stereotactic guided biopsy of suspicious calcifications in the upper-outer left breast spanning a distance of approximately 4 x 2 cm. EXAM: DIAGNOSTIC LEFT MAMMOGRAM POST STEREOTACTIC BIOPSY COMPARISON:  Previous exams. FINDINGS: Mammographic images were obtained following stereotactic guided biopsy of suspicious calcifications in the upper-outer left breast. A top hat shaped biopsy marking clip is present in the targeted region of the biopsied left breast calcifications. The calcifications in the upper-outer left breast span a distance of approximately 4 x 2 cm. IMPRESSION: Top hat shaped biopsy marking clip in the targeted region post stereotactic guided biopsy of the calcifications spanning a distance of 4 x 2 cm in the upper-outer left breast. Final Assessment: Post Procedure Mammograms for Marker Placement Electronically Signed   By: Everlean Alstrom M.D.   On: 03/16/2015 15:07   Dg C-arm 1-60 Min-no Report  03/02/2015  CLINICAL DATA: port placement C-ARM 1-60 MINUTES Fluoroscopy was utilized by the requesting physician.  No radiographic interpretation.   Mm Lt Breast Bx W Loc Dev 1st Lesion Image Bx Spec Stereo Guide  03/18/2015  ADDENDUM REPORT: 03/18/2015 12:09 ADDENDUM: Pathology results: Pathology results from the stereotactic guided biopsy of the calcifications in the upper-outer left breast spanning a distance of approximately 4 cm reveals diffuse lobular  carcinoma in situ. This is concordant with the imaging findings. The patient has been notified of the results. She is doing well and denies any biopsy site complications. Her oncologist, Dr. Cranford Mon has been notified of the results. MRI guided biopsy will be deferred given the positive results from the stereotactic guided biopsy of the calcifications. Treatment plan for known left breast malignancy is recommended. The patient has been instructed to call the [facility] with any questions or concerns. Electronically Signed   By: Everlean Alstrom M.D.   On: 03/18/2015 12:09  03/18/2015  CLINICAL DATA:  60 year old female with recently diagnosed invasive cancer in the upper inner left breast. Recent MRI demonstrated a suspicious area of enhancement in the upper-outer quadrant of the left breast which may correspond with coarse heterogeneous calcifications in the upper-outer left breast and stereotactic guided biopsy of the calcifications was  recommended. Note these calcifications in the upper-outer left breast spanning a distance of approximately 4 cm. EXAM: LEFT BREAST STEREOTACTIC CORE NEEDLE BIOPSY COMPARISON:  Previous exams. FINDINGS: The patient and I discussed the procedure of stereotactic-guided biopsy including benefits and alternatives. We discussed the high likelihood of a successful procedure. We discussed the risks of the procedure including infection, bleeding, tissue injury, clip migration, and inadequate sampling. Informed written consent was given. The usual time out protocol was performed immediately prior to the procedure. Using sterile technique and 1% Lidocaine as local anesthetic, under stereotactic guidance, a 9 gauge vacuum device was used to perform core needle biopsy of the calcifications in the upper-outer quadrant of the left breast using a superior to inferior approach. Specimen radiograph was performed showing the presence of calcifications. Specimens with calcifications are  identified for pathology. At the conclusion of the procedure, a top hat tissue marker clip was deployed into the biopsy cavity. Follow-up 2-view mammogram was performed and dictated separately. IMPRESSION: Stereotactic-guided biopsy of calcifications in the upper-outer left breast. Note these calcifications span a distance of approximately 4 cm. Electronically Signed: By: Everlean Alstrom M.D. On: 03/16/2015 15:08    ASSESSMENT: Clinical stage IIa ER/PR+, invasive lobular carcinoma of the left breast.  PLAN:    1. Breast cancer:  MRI results reviewed independently and reported as above. Biopsy of the lesion in the left upper outer quadrant approximately 4 cm away from the initial biopsy site consistent with invasive lobular carcinoma. We will proceed with cycle 1 of 4 Adriamycin and Cytoxan today. Patient will also receive an OnPro Neulasta with her treatments. This will be followed by 12 weeks of Taxol. She may or may not benefit from radiation therapy depending on the type of surgery she chooses. Ultimately, patient will require aromatase inhibitor for a minimum of 5 years. MUGA was denied by insurance, for cardiac echo revealed an EF between 55 and 60%. Return to clinic in 1 week for laboratory work and further evaluation, and then in 2 weeks for consideration of cycle 2.  Patient expressed understanding and was in agreement with this plan. She also understands that She can call clinic at any time with any questions, concerns, or complaints.   Breast cancer [facility])   Staging form: Breast, AJCC 7th Edition     Clinical stage from 02/12/2015: Stage IIA (T2, N0, M0) - Signed by Lloyd Huger, MD on 02/12/2015   Lloyd Huger, MD   03/28/2015 7:53 AM

## 2015-03-30 ENCOUNTER — Inpatient Hospital Stay: Payer: Managed Care, Other (non HMO)

## 2015-03-30 ENCOUNTER — Encounter: Payer: Self-pay | Admitting: Oncology

## 2015-03-30 ENCOUNTER — Inpatient Hospital Stay (HOSPITAL_BASED_OUTPATIENT_CLINIC_OR_DEPARTMENT_OTHER): Payer: Managed Care, Other (non HMO) | Admitting: Oncology

## 2015-03-30 VITALS — BP 130/89 | HR 73 | Temp 98.4°F | Wt 153.4 lb

## 2015-03-30 DIAGNOSIS — F419 Anxiety disorder, unspecified: Secondary | ICD-10-CM

## 2015-03-30 DIAGNOSIS — Z8669 Personal history of other diseases of the nervous system and sense organs: Secondary | ICD-10-CM

## 2015-03-30 DIAGNOSIS — M818 Other osteoporosis without current pathological fracture: Secondary | ICD-10-CM

## 2015-03-30 DIAGNOSIS — Z170 Estrogen receptor positive status [ER+]: Secondary | ICD-10-CM | POA: Diagnosis not present

## 2015-03-30 DIAGNOSIS — Z79899 Other long term (current) drug therapy: Secondary | ICD-10-CM

## 2015-03-30 DIAGNOSIS — C50912 Malignant neoplasm of unspecified site of left female breast: Secondary | ICD-10-CM

## 2015-03-30 DIAGNOSIS — D72819 Decreased white blood cell count, unspecified: Secondary | ICD-10-CM | POA: Insufficient documentation

## 2015-03-30 DIAGNOSIS — Z809 Family history of malignant neoplasm, unspecified: Secondary | ICD-10-CM

## 2015-03-30 DIAGNOSIS — M129 Arthropathy, unspecified: Secondary | ICD-10-CM

## 2015-03-30 DIAGNOSIS — C50412 Malignant neoplasm of upper-outer quadrant of left female breast: Secondary | ICD-10-CM | POA: Diagnosis not present

## 2015-03-30 DIAGNOSIS — R251 Tremor, unspecified: Secondary | ICD-10-CM

## 2015-03-30 DIAGNOSIS — M1990 Unspecified osteoarthritis, unspecified site: Secondary | ICD-10-CM | POA: Insufficient documentation

## 2015-03-30 DIAGNOSIS — G250 Essential tremor: Secondary | ICD-10-CM | POA: Insufficient documentation

## 2015-03-30 DIAGNOSIS — K219 Gastro-esophageal reflux disease without esophagitis: Secondary | ICD-10-CM

## 2015-03-30 DIAGNOSIS — M810 Age-related osteoporosis without current pathological fracture: Secondary | ICD-10-CM | POA: Insufficient documentation

## 2015-03-30 LAB — COMPREHENSIVE METABOLIC PANEL
ALBUMIN: 4 g/dL (ref 3.5–5.0)
ALT: 14 U/L (ref 14–54)
ANION GAP: 3 — AB (ref 5–15)
AST: 17 U/L (ref 15–41)
Alkaline Phosphatase: 72 U/L (ref 38–126)
BILIRUBIN TOTAL: 0.5 mg/dL (ref 0.3–1.2)
BUN: 15 mg/dL (ref 6–20)
CO2: 27 mmol/L (ref 22–32)
Calcium: 8.9 mg/dL (ref 8.9–10.3)
Chloride: 99 mmol/L — ABNORMAL LOW (ref 101–111)
Creatinine, Ser: 0.67 mg/dL (ref 0.44–1.00)
GFR calc Af Amer: >60 mL/min (ref >60)
GFR calc non Af Amer: >60 mL/min (ref >60)
GLUCOSE: 89 mg/dL (ref 65–99)
POTASSIUM: 4.1 mmol/L (ref 3.5–5.1)
SODIUM: 129 mmol/L — AB (ref 135–145)
TOTAL PROTEIN: 7 g/dL (ref 6.5–8.1)

## 2015-03-30 LAB — CBC WITH DIFFERENTIAL/PLATELET
BASOS PCT: 0 %
Basophils Absolute: 0 10*3/uL (ref 0–0.1)
EOS ABS: 0.1 10*3/uL (ref 0–0.7)
Eosinophils Relative: 6 %
HCT: 35 % (ref 35.0–47.0)
Hemoglobin: 12.1 g/dL (ref 12.0–16.0)
Lymphocytes Relative: 52 %
Lymphs Abs: 1.1 10*3/uL (ref 1.0–3.6)
MCH: 31.7 pg (ref 26.0–34.0)
MCHC: 34.6 g/dL (ref 32.0–36.0)
MCV: 91.8 fL (ref 80.0–100.0)
MONO ABS: 0.4 10*3/uL (ref 0.2–0.9)
MONOS PCT: 18 %
Neutro Abs: 0.5 10*3/uL — ABNORMAL LOW (ref 1.4–6.5)
Neutrophils Relative %: 24 %
Platelets: 189 10*3/uL (ref 150–440)
RBC: 3.82 MIL/uL (ref 3.80–5.20)
RDW: 12.5 % (ref 11.5–14.5)
WBC: 2.1 10*3/uL — ABNORMAL LOW (ref 3.6–11.0)

## 2015-04-06 ENCOUNTER — Inpatient Hospital Stay: Payer: Managed Care, Other (non HMO)

## 2015-04-06 ENCOUNTER — Inpatient Hospital Stay: Payer: Managed Care, Other (non HMO) | Attending: Oncology | Admitting: Oncology

## 2015-04-06 VITALS — BP 119/80 | HR 76 | Temp 97.2°F | Resp 18 | Wt 153.2 lb

## 2015-04-06 DIAGNOSIS — K219 Gastro-esophageal reflux disease without esophagitis: Secondary | ICD-10-CM | POA: Diagnosis not present

## 2015-04-06 DIAGNOSIS — M818 Other osteoporosis without current pathological fracture: Secondary | ICD-10-CM | POA: Diagnosis not present

## 2015-04-06 DIAGNOSIS — C50412 Malignant neoplasm of upper-outer quadrant of left female breast: Secondary | ICD-10-CM | POA: Insufficient documentation

## 2015-04-06 DIAGNOSIS — Z803 Family history of malignant neoplasm of breast: Secondary | ICD-10-CM | POA: Insufficient documentation

## 2015-04-06 DIAGNOSIS — Z170 Estrogen receptor positive status [ER+]: Secondary | ICD-10-CM | POA: Diagnosis not present

## 2015-04-06 DIAGNOSIS — Z5111 Encounter for antineoplastic chemotherapy: Secondary | ICD-10-CM | POA: Diagnosis not present

## 2015-04-06 DIAGNOSIS — M129 Arthropathy, unspecified: Secondary | ICD-10-CM | POA: Insufficient documentation

## 2015-04-06 DIAGNOSIS — Z8669 Personal history of other diseases of the nervous system and sense organs: Secondary | ICD-10-CM

## 2015-04-06 DIAGNOSIS — F419 Anxiety disorder, unspecified: Secondary | ICD-10-CM | POA: Insufficient documentation

## 2015-04-06 DIAGNOSIS — Z7689 Persons encountering health services in other specified circumstances: Secondary | ICD-10-CM

## 2015-04-06 DIAGNOSIS — R21 Rash and other nonspecific skin eruption: Secondary | ICD-10-CM | POA: Diagnosis not present

## 2015-04-06 DIAGNOSIS — C50912 Malignant neoplasm of unspecified site of left female breast: Secondary | ICD-10-CM

## 2015-04-06 DIAGNOSIS — R51 Headache: Secondary | ICD-10-CM | POA: Diagnosis not present

## 2015-04-06 DIAGNOSIS — Z79899 Other long term (current) drug therapy: Secondary | ICD-10-CM | POA: Insufficient documentation

## 2015-04-06 LAB — CBC WITH DIFFERENTIAL/PLATELET
BASOS PCT: 1 %
Basophils Absolute: 0.1 10*3/uL (ref 0–0.1)
EOS PCT: 0 %
Eosinophils Absolute: 0 10*3/uL (ref 0–0.7)
HCT: 34.9 % — ABNORMAL LOW (ref 35.0–47.0)
HEMOGLOBIN: 11.9 g/dL — AB (ref 12.0–16.0)
Lymphocytes Relative: 15 %
Lymphs Abs: 1.7 10*3/uL (ref 1.0–3.6)
MCH: 31.6 pg (ref 26.0–34.0)
MCHC: 34.2 g/dL (ref 32.0–36.0)
MCV: 92.3 fL (ref 80.0–100.0)
MONOS PCT: 12 %
Monocytes Absolute: 1.3 10*3/uL — ABNORMAL HIGH (ref 0.2–0.9)
NEUTROS PCT: 72 %
Neutro Abs: 8.2 10*3/uL — ABNORMAL HIGH (ref 1.4–6.5)
PLATELETS: 189 10*3/uL (ref 150–440)
RBC: 3.79 MIL/uL — ABNORMAL LOW (ref 3.80–5.20)
RDW: 12.5 % (ref 11.5–14.5)
WBC: 11.3 10*3/uL — AB (ref 3.6–11.0)

## 2015-04-06 LAB — COMPREHENSIVE METABOLIC PANEL
ALBUMIN: 3.9 g/dL (ref 3.5–5.0)
ALK PHOS: 73 U/L (ref 38–126)
ALT: 17 U/L (ref 14–54)
ANION GAP: 5 (ref 5–15)
AST: 18 U/L (ref 15–41)
BUN: 15 mg/dL (ref 6–20)
CHLORIDE: 103 mmol/L (ref 101–111)
CO2: 26 mmol/L (ref 22–32)
Calcium: 8.7 mg/dL — ABNORMAL LOW (ref 8.9–10.3)
Creatinine, Ser: 0.63 mg/dL (ref 0.44–1.00)
GFR calc non Af Amer: >60 mL/min (ref >60)
GLUCOSE: 85 mg/dL (ref 65–99)
POTASSIUM: 4.4 mmol/L (ref 3.5–5.1)
SODIUM: 134 mmol/L — AB (ref 135–145)
Total Bilirubin: 0.3 mg/dL (ref 0.3–1.2)
Total Protein: 6.8 g/dL (ref 6.5–8.1)

## 2015-04-06 MED ORDER — DOXORUBICIN HCL CHEMO IV INJECTION 2 MG/ML
60.0000 mg/m2 | Freq: Once | INTRAVENOUS | Status: AC
  Administered 2015-04-06: 108 mg via INTRAVENOUS
  Filled 2015-04-06: qty 54

## 2015-04-06 MED ORDER — PALONOSETRON HCL INJECTION 0.25 MG/5ML
0.2500 mg | Freq: Once | INTRAVENOUS | Status: AC
Start: 2015-04-06 — End: 2015-04-06
  Administered 2015-04-06: 0.25 mg via INTRAVENOUS
  Filled 2015-04-06: qty 5

## 2015-04-06 MED ORDER — SODIUM CHLORIDE 0.9% FLUSH
10.0000 mL | Freq: Once | INTRAVENOUS | Status: AC
  Administered 2015-04-06: 10 mL via INTRAVENOUS
  Filled 2015-04-06: qty 10

## 2015-04-06 MED ORDER — FOSAPREPITANT DIMEGLUMINE INJECTION 150 MG
Freq: Once | INTRAVENOUS | Status: AC
  Administered 2015-04-06: 10:00:00 via INTRAVENOUS
  Filled 2015-04-06: qty 5

## 2015-04-06 MED ORDER — PEGFILGRASTIM 6 MG/0.6ML ~~LOC~~ PSKT
6.0000 mg | PREFILLED_SYRINGE | Freq: Once | SUBCUTANEOUS | Status: AC
  Administered 2015-04-06: 6 mg via SUBCUTANEOUS
  Filled 2015-04-06: qty 0.6

## 2015-04-06 MED ORDER — SODIUM CHLORIDE 0.9 % IV SOLN
1000.0000 mg | Freq: Once | INTRAVENOUS | Status: AC
  Administered 2015-04-06: 1000 mg via INTRAVENOUS
  Filled 2015-04-06: qty 50

## 2015-04-06 MED ORDER — SODIUM CHLORIDE 0.9 % IV SOLN
Freq: Once | INTRAVENOUS | Status: AC
  Administered 2015-04-06: 10:00:00 via INTRAVENOUS
  Filled 2015-04-06: qty 1000

## 2015-04-06 MED ORDER — HEPARIN SOD (PORK) LOCK FLUSH 100 UNIT/ML IV SOLN
500.0000 [IU] | Freq: Once | INTRAVENOUS | Status: AC | PRN
  Administered 2015-04-06: 500 [IU]
  Filled 2015-04-06: qty 5

## 2015-04-06 NOTE — Progress Notes (Signed)
For a week after treatment patient will have headaches.

## 2015-04-10 NOTE — Progress Notes (Signed)
Marisa Evans  Telephone:(336) [phone] Fax:(336) [phone]  ID: SHAMONI MEARS OB: 1955/11/23  MR#: [id number]  [id number]  Patient Care Team: Adin Hector, MD as PCP - General (Internal Medicine)  CHIEF COMPLAINT:  Chief Complaint  Patient presents with  . Breast Cancer  . headaches    INTERVAL HISTORY: Patient returns to clinic today for further evaluation and to assess her toleration of cycle 1 of 4 of Adriamycin and Cytoxan. She tolerated her treatment well without significant side effects. She currently feels well and is asymptomatic. She has no neurologic complaints. She denies any recent fevers. She has a good appetite and denies weight loss. She has no chest pain or shortness of breath. She denies any nausea, vomiting, constipation, or diarrhea. She has no urinary complaints. Patient offers no specific complaints today.  REVIEW OF SYSTEMS:   Review of Systems  Constitutional: Negative for fever, weight loss and malaise/fatigue.  Respiratory: Negative.  Negative for shortness of breath.   Cardiovascular: Negative.  Negative for chest pain.  Gastrointestinal: Negative.  Negative for abdominal pain.  Musculoskeletal: Negative.   Neurological: Negative.  Negative for weakness.  Psychiatric/Behavioral: The patient is not nervous/anxious.     As per HPI. Otherwise, a complete review of systems is negatve.  PAST MEDICAL HISTORY: Past Medical History  Diagnosis Date  . Anxiety   . Migraine   . H/O tubal ligation   . Osteoporosis   . Arthritis   . H/O breast biopsy   . GERD (gastroesophageal reflux disease)     NO MEDS  . Cancer ([place])   . Family history of adverse reaction to anesthesia     PTS UNCLE   . Tremor     TAKES PROPRANOLOL FOR TREMORS    PAST SURGICAL HISTORY: Past Surgical History  Procedure Laterality Date  . Wrist fracture surgery Right 2014  . Breast biopsy Bilateral 1996    neg  . Breast biopsy Left 2017  . Portacath  placement Right 03/02/2015    Procedure: INSERTION PORT-A-CATH;  Surgeon: Leonie Green, MD;  Location: [facility] ORS;  Service: General;  Laterality: Right;    FAMILY HISTORY Family History  Problem Relation Age of Onset  . Hypertension Father   . Cancer Maternal Uncle   . Cancer Paternal Uncle   . Liver disease Paternal Grandfather        ADVANCED DIRECTIVES:    HEALTH MAINTENANCE: Social History  Substance Use Topics  . Smoking status: Never Smoker   . Smokeless tobacco: None  . Alcohol Use: No     Colonoscopy:  PAP:  Bone density:  Lipid panel:  Allergies  Allergen Reactions  . Codeine Nausea Only  . Topamax [Topiramate] Other (See Comments)    Lightheaded, feels "out of body"    Current Outpatient Prescriptions  Medication Sig Dispense Refill  . alendronate (FOSAMAX) 70 MG tablet TAKE 1 TABLET BY MOUTH EVERY WEEK    . calcium-vitamin D 250-100 MG-UNIT tablet Take 2 tablets by mouth 2 (two) times daily. Reported on 02/25/2015    . Cholecalciferol (D 1000) 1000 units capsule Take 1,000 Units by mouth daily.    . Glucosamine Sulfate 500 MG TABS Take by mouth. Reported on 02/25/2015    . lidocaine-prilocaine (EMLA) cream Apply 1 application topically as needed. Apply to port then cover with saran wrap, 1-2 hours before chemotherapy appointment 30 g 2  . Misc Natural Products (OSTEO BI-FLEX ADV DOUBLE ST) TABS Take 1 tablet by mouth  daily.    . ondansetron (ZOFRAN) 8 MG tablet Take 1 tablet (8 mg total) by mouth 2 (two) times daily as needed. Start on the third day after chemotherapy. 30 tablet 1  . prochlorperazine (COMPAZINE) 10 MG tablet Take 1 tablet (10 mg total) by mouth every 6 (six) hours as needed (Nausea or vomiting). 30 tablet 1  . propranolol (INDERAL) 10 MG tablet Take 10 mg by mouth 2 (two) times daily.    Marland Kitchen venlafaxine XR (EFFEXOR-XR) 150 MG 24 hr capsule TAKE ONE CAPSULE BY MOUTH EVERY DAY-PM     No current facility-administered medications for this  visit.    OBJECTIVE: Filed Vitals:   03/30/15 1126  BP: 130/89  Pulse: 73  Temp: 98.4 F (36.9 C)     Body mass index is 26.32 kg/(m^2).    ECOG FS:0 - Asymptomatic  General: Well-developed, well-nourished, no acute distress. Eyes: Pink conjunctiva, anicteric sclera. Breasts: Patient requested exam be deferred today. Lungs: Clear to auscultation bilaterally. Heart: Regular rate and rhythm. No rubs, murmurs, or gallops. Abdomen: Soft, nontender, nondistended. No organomegaly noted, normoactive bowel sounds. Musculoskeletal: No edema, cyanosis, or clubbing. Neuro: Alert, answering all questions appropriately. Cranial nerves grossly intact. Skin: No rashes or petechiae noted. Psych: Normal affect.  LAB RESULTS:  Lab Results  Component Value Date   NA 134* 04/06/2015   K 4.4 04/06/2015   CL 103 04/06/2015   CO2 26 04/06/2015   GLUCOSE 85 04/06/2015   BUN 15 04/06/2015   CREATININE 0.63 04/06/2015   CALCIUM 8.7* 04/06/2015   PROT 6.8 04/06/2015   ALBUMIN 3.9 04/06/2015   AST 18 04/06/2015   ALT 17 04/06/2015   ALKPHOS 73 04/06/2015   BILITOT 0.3 04/06/2015   GFRNONAA >60 04/06/2015   GFRAA >60 04/06/2015    Lab Results  Component Value Date   WBC 11.3* 04/06/2015   NEUTROABS 8.2* 04/06/2015   HGB 11.9* 04/06/2015   HCT 34.9* 04/06/2015   MCV 92.3 04/06/2015   PLT 189 04/06/2015     STUDIES: Mm Digital Diagnostic Unilat L  03/16/2015  CLINICAL DATA:  Post stereotactic guided biopsy of suspicious calcifications in the upper-outer left breast spanning a distance of approximately 4 x 2 cm. EXAM: DIAGNOSTIC LEFT MAMMOGRAM POST STEREOTACTIC BIOPSY COMPARISON:  Previous exams. FINDINGS: Mammographic images were obtained following stereotactic guided biopsy of suspicious calcifications in the upper-outer left breast. A top hat shaped biopsy marking clip is present in the targeted region of the biopsied left breast calcifications. The calcifications in the upper-outer left  breast span a distance of approximately 4 x 2 cm. IMPRESSION: Top hat shaped biopsy marking clip in the targeted region post stereotactic guided biopsy of the calcifications spanning a distance of 4 x 2 cm in the upper-outer left breast. Final Assessment: Post Procedure Mammograms for Marker Placement Electronically Signed   By: Everlean Alstrom M.D.   On: 03/16/2015 15:07   Mm Lt Breast Bx W Loc Dev 1st Lesion Image Bx Spec Stereo Guide  03/18/2015  ADDENDUM REPORT: 03/18/2015 12:09 ADDENDUM: Pathology results: Pathology results from the stereotactic guided biopsy of the calcifications in the upper-outer left breast spanning a distance of approximately 4 cm reveals diffuse lobular carcinoma in situ. This is concordant with the imaging findings. The patient has been notified of the results. She is doing well and denies any biopsy site complications. Her oncologist, Dr. Cranford Mon has been notified of the results. MRI guided biopsy will be deferred given the positive results from  the stereotactic guided biopsy of the calcifications. Treatment plan for known left breast malignancy is recommended. The patient has been instructed to call the [facility] with any questions or concerns. Electronically Signed   By: Everlean Alstrom M.D.   On: 03/18/2015 12:09  03/18/2015  CLINICAL DATA:  60 year old female with recently diagnosed invasive cancer in the upper inner left breast. Recent MRI demonstrated a suspicious area of enhancement in the upper-outer quadrant of the left breast which may correspond with coarse heterogeneous calcifications in the upper-outer left breast and stereotactic guided biopsy of the calcifications was recommended. Note these calcifications in the upper-outer left breast spanning a distance of approximately 4 cm. EXAM: LEFT BREAST STEREOTACTIC CORE NEEDLE BIOPSY COMPARISON:  Previous exams. FINDINGS: The patient and I discussed the procedure of stereotactic-guided biopsy including  benefits and alternatives. We discussed the high likelihood of a successful procedure. We discussed the risks of the procedure including infection, bleeding, tissue injury, clip migration, and inadequate sampling. Informed written consent was given. The usual time out protocol was performed immediately prior to the procedure. Using sterile technique and 1% Lidocaine as local anesthetic, under stereotactic guidance, a 9 gauge vacuum device was used to perform core needle biopsy of the calcifications in the upper-outer quadrant of the left breast using a superior to inferior approach. Specimen radiograph was performed showing the presence of calcifications. Specimens with calcifications are identified for pathology. At the conclusion of the procedure, a top hat tissue marker clip was deployed into the biopsy cavity. Follow-up 2-view mammogram was performed and dictated separately. IMPRESSION: Stereotactic-guided biopsy of calcifications in the upper-outer left breast. Note these calcifications span a distance of approximately 4 cm. Electronically Signed: By: Everlean Alstrom M.D. On: 03/16/2015 15:08    ASSESSMENT: Clinical stage IIa ER/PR+, invasive lobular carcinoma of the left breast.  PLAN:    1. Breast cancer:  Breast MRI results reviewed independently. Biopsy of the lesion in the left upper outer quadrant approximately 4 cm away from the initial biopsy site consistent with invasive lobular carcinoma. Patient tolerated cycle 1 of 4 Adriamycin and Cytoxan today. Patient will also receive an OnPro Neulasta with her treatments. This will be followed by 12 weeks of Taxol. She may or may not benefit from radiation therapy depending on the type of surgery she chooses. Ultimately, patient will require aromatase inhibitor for a minimum of 5 years. MUGA was denied by insurance, for cardiac echo revealed an EF between 55 and 60%. Return to clinic in 1 week for consideration of cycle 2.  Patient expressed  understanding and was in agreement with this plan. She also understands that She can call clinic at any time with any questions, concerns, or complaints.   Breast cancer [facility])   Staging form: Breast, AJCC 7th Edition     Clinical stage from 02/12/2015: Stage IIA (T2, N0, M0) - Signed by Lloyd Huger, MD on 02/12/2015   Lloyd Huger, MD   04/10/2015 6:36 AM

## 2015-04-11 NOTE — Progress Notes (Signed)
[place]  [phone]) [phone] Fax:(336) [phone]  ID: Marisa Evans OB: 28-Mar-1955  MR#: [id number]  [id number]  Patient Care Team: Adin Hector, MD as PCP - General (Internal Medicine)  CHIEF COMPLAINT:  Chief Complaint  Patient presents with  . Breast Cancer    INTERVAL HISTORY: Patient returns to clinic today for further evaluation and consideration of cycle 2 of 4 of Adriamycin and Cytoxan. She had headaches, particularly after her Neulasta injection that these have since resolved. She currently feels well and is asymptomatic. She has no neurologic complaints. She denies any recent fevers. She has a good appetite and denies weight loss. She has no chest pain or shortness of breath. She denies any nausea, vomiting, constipation, or diarrhea. She has no urinary complaints. Patient offers no further specific complaints today.  REVIEW OF SYSTEMS:   Review of Systems  Constitutional: Negative for fever, weight loss and malaise/fatigue.  Respiratory: Negative.  Negative for shortness of breath.   Cardiovascular: Negative.  Negative for chest pain.  Gastrointestinal: Negative.  Negative for abdominal pain.  Musculoskeletal: Negative.   Neurological: Positive for headaches. Negative for weakness.  Psychiatric/Behavioral: The patient is not nervous/anxious.     As per HPI. Otherwise, a complete review of systems is negatve.  PAST MEDICAL HISTORY: Past Medical History  Diagnosis Date  . Anxiety   . Migraine   . H/O tubal ligation   . Osteoporosis   . Arthritis   . H/O breast biopsy   . GERD (gastroesophageal reflux disease)     NO MEDS  . Cancer ([place])   . Family history of adverse reaction to anesthesia     PTS UNCLE   . Tremor     TAKES PROPRANOLOL FOR TREMORS    PAST SURGICAL HISTORY: Past Surgical History  Procedure Laterality Date  . Wrist fracture surgery Right 2014  . Breast biopsy Bilateral 1996    neg  . Breast biopsy Left  2017  . Portacath placement Right 03/02/2015    Procedure: INSERTION PORT-A-CATH;  Surgeon: Leonie Green, MD;  Location: [facility] ORS;  Service: General;  Laterality: Right;    FAMILY HISTORY Family History  Problem Relation Age of Onset  . Hypertension Father   . Cancer Maternal Uncle   . Cancer Paternal Uncle   . Liver disease Paternal Grandfather        ADVANCED DIRECTIVES:    HEALTH MAINTENANCE: Social History  Substance Use Topics  . Smoking status: Never Smoker   . Smokeless tobacco: Not on file  . Alcohol Use: No     Colonoscopy:  PAP:  Bone density:  Lipid panel:  Allergies  Allergen Reactions  . Codeine Nausea Only  . Topamax [Topiramate] Other (See Comments)    Lightheaded, feels "out of body"    Current Outpatient Prescriptions  Medication Sig Dispense Refill  . alendronate (FOSAMAX) 70 MG tablet TAKE 1 TABLET BY MOUTH EVERY WEEK    . calcium-vitamin D 250-100 MG-UNIT tablet Take 2 tablets by mouth 2 (two) times daily. Reported on 02/25/2015    . Cholecalciferol (D 1000) 1000 units capsule Take 1,000 Units by mouth daily.    . Glucosamine Sulfate 500 MG TABS Take by mouth. Reported on 02/25/2015    . lidocaine-prilocaine (EMLA) cream Apply 1 application topically as needed. Apply to port then cover with saran wrap, 1-2 hours before chemotherapy appointment 30 g 2  . Misc Natural Products (OSTEO BI-FLEX ADV DOUBLE ST) TABS Take 1 tablet  by mouth daily.    . ondansetron (ZOFRAN) 8 MG tablet Take 1 tablet (8 mg total) by mouth 2 (two) times daily as needed. Start on the third day after chemotherapy. 30 tablet 1  . prochlorperazine (COMPAZINE) 10 MG tablet Take 1 tablet (10 mg total) by mouth every 6 (six) hours as needed (Nausea or vomiting). 30 tablet 1  . propranolol (INDERAL) 10 MG tablet Take 10 mg by mouth 2 (two) times daily.    Marland Kitchen venlafaxine XR (EFFEXOR-XR) 150 MG 24 hr capsule TAKE ONE CAPSULE BY MOUTH EVERY DAY-PM     No current  facility-administered medications for this visit.    OBJECTIVE: Filed Vitals:   04/06/15 0919  BP: 119/80  Pulse: 76  Temp: 97.2 F (36.2 C)  Resp: 18     Body mass index is 26.29 kg/(m^2).    ECOG FS:0 - Asymptomatic  General: Well-developed, well-nourished, no acute distress. Eyes: Pink conjunctiva, anicteric sclera. Breasts: Patient requested exam be deferred today. Lungs: Clear to auscultation bilaterally. Heart: Regular rate and rhythm. No rubs, murmurs, or gallops. Abdomen: Soft, nontender, nondistended. No organomegaly noted, normoactive bowel sounds. Musculoskeletal: No edema, cyanosis, or clubbing. Neuro: Alert, answering all questions appropriately. Cranial nerves grossly intact. Skin: No rashes or petechiae noted. Psych: Normal affect.  LAB RESULTS:  Lab Results  Component Value Date   NA 134* 04/06/2015   K 4.4 04/06/2015   CL 103 04/06/2015   CO2 26 04/06/2015   GLUCOSE 85 04/06/2015   BUN 15 04/06/2015   CREATININE 0.63 04/06/2015   CALCIUM 8.7* 04/06/2015   PROT 6.8 04/06/2015   ALBUMIN 3.9 04/06/2015   AST 18 04/06/2015   ALT 17 04/06/2015   ALKPHOS 73 04/06/2015   BILITOT 0.3 04/06/2015   GFRNONAA >60 04/06/2015   GFRAA >60 04/06/2015    Lab Results  Component Value Date   WBC 11.3* 04/06/2015   NEUTROABS 8.2* 04/06/2015   HGB 11.9* 04/06/2015   HCT 34.9* 04/06/2015   MCV 92.3 04/06/2015   PLT 189 04/06/2015     STUDIES: Mm Digital Diagnostic Unilat L  03/16/2015  CLINICAL DATA:  Post stereotactic guided biopsy of suspicious calcifications in the upper-outer left breast spanning a distance of approximately 4 x 2 cm. EXAM: DIAGNOSTIC LEFT MAMMOGRAM POST STEREOTACTIC BIOPSY COMPARISON:  Previous exams. FINDINGS: Mammographic images were obtained following stereotactic guided biopsy of suspicious calcifications in the upper-outer left breast. A top hat shaped biopsy marking clip is present in the targeted region of the biopsied left breast  calcifications. The calcifications in the upper-outer left breast span a distance of approximately 4 x 2 cm. IMPRESSION: Top hat shaped biopsy marking clip in the targeted region post stereotactic guided biopsy of the calcifications spanning a distance of 4 x 2 cm in the upper-outer left breast. Final Assessment: Post Procedure Mammograms for Marker Placement Electronically Signed   By: Everlean Alstrom M.D.   On: 03/16/2015 15:07   Mm Lt Breast Bx W Loc Dev 1st Lesion Image Bx Spec Stereo Guide  03/18/2015  ADDENDUM REPORT: 03/18/2015 12:09 ADDENDUM: Pathology results: Pathology results from the stereotactic guided biopsy of the calcifications in the upper-outer left breast spanning a distance of approximately 4 cm reveals diffuse lobular carcinoma in situ. This is concordant with the imaging findings. The patient has been notified of the results. She is doing well and denies any biopsy site complications. Her oncologist, Dr. Cranford Mon has been notified of the results. MRI guided biopsy will be deferred  given the positive results from the stereotactic guided biopsy of the calcifications. Treatment plan for known left breast malignancy is recommended. The patient has been instructed to call the [facility] with any questions or concerns. Electronically Signed   By: Everlean Alstrom M.D.   On: 03/18/2015 12:09  03/18/2015  CLINICAL DATA:  60 year old female with recently diagnosed invasive cancer in the upper inner left breast. Recent MRI demonstrated a suspicious area of enhancement in the upper-outer quadrant of the left breast which may correspond with coarse heterogeneous calcifications in the upper-outer left breast and stereotactic guided biopsy of the calcifications was recommended. Note these calcifications in the upper-outer left breast spanning a distance of approximately 4 cm. EXAM: LEFT BREAST STEREOTACTIC CORE NEEDLE BIOPSY COMPARISON:  Previous exams. FINDINGS: The patient and I discussed  the procedure of stereotactic-guided biopsy including benefits and alternatives. We discussed the high likelihood of a successful procedure. We discussed the risks of the procedure including infection, bleeding, tissue injury, clip migration, and inadequate sampling. Informed written consent was given. The usual time out protocol was performed immediately prior to the procedure. Using sterile technique and 1% Lidocaine as local anesthetic, under stereotactic guidance, a 9 gauge vacuum device was used to perform core needle biopsy of the calcifications in the upper-outer quadrant of the left breast using a superior to inferior approach. Specimen radiograph was performed showing the presence of calcifications. Specimens with calcifications are identified for pathology. At the conclusion of the procedure, a top hat tissue marker clip was deployed into the biopsy cavity. Follow-up 2-view mammogram was performed and dictated separately. IMPRESSION: Stereotactic-guided biopsy of calcifications in the upper-outer left breast. Note these calcifications span a distance of approximately 4 cm. Electronically Signed: By: Everlean Alstrom M.D. On: 03/16/2015 15:08    ASSESSMENT: Clinical stage IIa ER/PR+, invasive lobular carcinoma of the left breast.  PLAN:    1. Breast cancer:  Breast MRI results reviewed independently. Biopsy of the lesion in the left upper outer quadrant approximately 4 cm away from the initial biopsy site consistent with invasive lobular carcinoma. Proceed with cycle 2 of 4 Adriamycin and Cytoxan today. Patient will also receive an OnPro Neulasta with her treatments. This will be followed by 12 weeks of Taxol. She may or may not benefit from radiation therapy depending on the type of surgery she chooses. Ultimately, patient will require aromatase inhibitor for a minimum of 5 years. MUGA was denied by insurance, for cardiac echo revealed an EF between 55 and 60%. Return to clinic in 2 weeks for  consideration of cycle 3. 2. Headaches: Possibly secondary to Neulasta, monitor.  Patient expressed understanding and was in agreement with this plan. She also understands that She can call clinic at any time with any questions, concerns, or complaints.   Breast cancer [facility])   Staging form: Breast, AJCC 7th Edition     Clinical stage from 02/12/2015: Stage IIA (T2, N0, M0) - Signed by Lloyd Huger, MD on 02/12/2015   Lloyd Huger, MD   04/11/2015 4:44 PM

## 2015-04-20 ENCOUNTER — Inpatient Hospital Stay: Payer: Managed Care, Other (non HMO)

## 2015-04-20 ENCOUNTER — Inpatient Hospital Stay (HOSPITAL_BASED_OUTPATIENT_CLINIC_OR_DEPARTMENT_OTHER): Payer: Managed Care, Other (non HMO) | Admitting: Oncology

## 2015-04-20 VITALS — BP 139/83 | HR 77 | Temp 97.8°F | Resp 18 | Ht 64.0 in | Wt 153.7 lb

## 2015-04-20 DIAGNOSIS — M818 Other osteoporosis without current pathological fracture: Secondary | ICD-10-CM

## 2015-04-20 DIAGNOSIS — K219 Gastro-esophageal reflux disease without esophagitis: Secondary | ICD-10-CM

## 2015-04-20 DIAGNOSIS — C50912 Malignant neoplasm of unspecified site of left female breast: Secondary | ICD-10-CM

## 2015-04-20 DIAGNOSIS — Z79899 Other long term (current) drug therapy: Secondary | ICD-10-CM | POA: Diagnosis not present

## 2015-04-20 DIAGNOSIS — Z8669 Personal history of other diseases of the nervous system and sense organs: Secondary | ICD-10-CM

## 2015-04-20 DIAGNOSIS — Z7689 Persons encountering health services in other specified circumstances: Secondary | ICD-10-CM | POA: Diagnosis not present

## 2015-04-20 DIAGNOSIS — M129 Arthropathy, unspecified: Secondary | ICD-10-CM

## 2015-04-20 DIAGNOSIS — Z170 Estrogen receptor positive status [ER+]: Secondary | ICD-10-CM | POA: Diagnosis not present

## 2015-04-20 DIAGNOSIS — R51 Headache: Secondary | ICD-10-CM

## 2015-04-20 DIAGNOSIS — R21 Rash and other nonspecific skin eruption: Secondary | ICD-10-CM

## 2015-04-20 DIAGNOSIS — Z803 Family history of malignant neoplasm of breast: Secondary | ICD-10-CM

## 2015-04-20 DIAGNOSIS — F419 Anxiety disorder, unspecified: Secondary | ICD-10-CM

## 2015-04-20 DIAGNOSIS — C50412 Malignant neoplasm of upper-outer quadrant of left female breast: Secondary | ICD-10-CM

## 2015-04-20 LAB — COMPREHENSIVE METABOLIC PANEL
ALBUMIN: 4 g/dL (ref 3.5–5.0)
ALK PHOS: 76 U/L (ref 38–126)
ALT: 17 U/L (ref 14–54)
AST: 20 U/L (ref 15–41)
Anion gap: 4 — ABNORMAL LOW (ref 5–15)
BUN: 16 mg/dL (ref 6–20)
CHLORIDE: 103 mmol/L (ref 101–111)
CO2: 25 mmol/L (ref 22–32)
CREATININE: 0.66 mg/dL (ref 0.44–1.00)
Calcium: 8.1 mg/dL — ABNORMAL LOW (ref 8.9–10.3)
GFR calc Af Amer: >60 mL/min (ref >60)
GFR calc non Af Amer: >60 mL/min (ref >60)
GLUCOSE: 94 mg/dL (ref 65–99)
Potassium: 4.2 mmol/L (ref 3.5–5.1)
SODIUM: 132 mmol/L — AB (ref 135–145)
Total Bilirubin: 0.4 mg/dL (ref 0.3–1.2)
Total Protein: 6.6 g/dL (ref 6.5–8.1)

## 2015-04-20 LAB — CBC WITH DIFFERENTIAL/PLATELET
BASOS ABS: 0.1 10*3/uL (ref 0–0.1)
Basophils Relative: 0 %
EOS ABS: 0 10*3/uL (ref 0–0.7)
EOS PCT: 0 %
HCT: 32 % — ABNORMAL LOW (ref 35.0–47.0)
HEMOGLOBIN: 11 g/dL — AB (ref 12.0–16.0)
Lymphocytes Relative: 7 %
Lymphs Abs: 1.3 10*3/uL (ref 1.0–3.6)
MCH: 31.7 pg (ref 26.0–34.0)
MCHC: 34.4 g/dL (ref 32.0–36.0)
MCV: 92.2 fL (ref 80.0–100.0)
Monocytes Absolute: 1.5 10*3/uL — ABNORMAL HIGH (ref 0.2–0.9)
Monocytes Relative: 8 %
NEUTROS PCT: 85 %
Neutro Abs: 16.1 10*3/uL — ABNORMAL HIGH (ref 1.4–6.5)
PLATELETS: 206 10*3/uL (ref 150–440)
RBC: 3.48 MIL/uL — AB (ref 3.80–5.20)
RDW: 12.8 % (ref 11.5–14.5)
WBC: 19 10*3/uL — AB (ref 3.6–11.0)

## 2015-04-20 MED ORDER — PALONOSETRON HCL INJECTION 0.25 MG/5ML
0.2500 mg | Freq: Once | INTRAVENOUS | Status: AC
  Administered 2015-04-20: 0.25 mg via INTRAVENOUS
  Filled 2015-04-20: qty 5

## 2015-04-20 MED ORDER — SODIUM CHLORIDE 0.9 % IV SOLN
Freq: Once | INTRAVENOUS | Status: AC
  Administered 2015-04-20: 10:00:00 via INTRAVENOUS
  Filled 2015-04-20: qty 1000

## 2015-04-20 MED ORDER — SODIUM CHLORIDE 0.9% FLUSH
10.0000 mL | INTRAVENOUS | Status: DC | PRN
  Administered 2015-04-20: 10 mL
  Filled 2015-04-20: qty 10

## 2015-04-20 MED ORDER — HEPARIN SOD (PORK) LOCK FLUSH 100 UNIT/ML IV SOLN
500.0000 [IU] | Freq: Once | INTRAVENOUS | Status: AC | PRN
  Administered 2015-04-20: 500 [IU]
  Filled 2015-04-20: qty 5

## 2015-04-20 MED ORDER — DOXORUBICIN HCL CHEMO IV INJECTION 2 MG/ML
60.0000 mg/m2 | Freq: Once | INTRAVENOUS | Status: AC
  Administered 2015-04-20: 108 mg via INTRAVENOUS
  Filled 2015-04-20: qty 54

## 2015-04-20 MED ORDER — SODIUM CHLORIDE 0.9 % IV SOLN
Freq: Once | INTRAVENOUS | Status: AC
  Administered 2015-04-20: 11:00:00 via INTRAVENOUS
  Filled 2015-04-20: qty 5

## 2015-04-20 MED ORDER — SODIUM CHLORIDE 0.9 % IV SOLN
1000.0000 mg | Freq: Once | INTRAVENOUS | Status: AC
  Administered 2015-04-20: 1000 mg via INTRAVENOUS
  Filled 2015-04-20: qty 50

## 2015-04-20 MED ORDER — PEGFILGRASTIM 6 MG/0.6ML ~~LOC~~ PSKT
6.0000 mg | PREFILLED_SYRINGE | Freq: Once | SUBCUTANEOUS | Status: AC
  Administered 2015-04-20: 6 mg via SUBCUTANEOUS
  Filled 2015-04-20: qty 0.6

## 2015-04-20 NOTE — Progress Notes (Signed)
Pt reports having a question about a reddened area in shape of crescent moon almost on upper right arm.  Area not raised, does not itch and has been there for possible close to a year, however the redness just noticed in the past couple of weeks. Reported Nausea first week and into the second week after treatment no vomitting.  No other concerns noted.  Reports good appetite past three days and has eaten like a horse.

## 2015-04-20 NOTE — Progress Notes (Signed)
[place]  Telephone:(336) [phone] Fax:(336) [phone]  ID: Marisa Evans OB: Jan 25, 1956  MR#: [id number]  [id number]  Patient Care Team: Adin Hector, MD as PCP - General (Internal Medicine)  CHIEF COMPLAINT:  Chief Complaint  Patient presents with  . Follow-up    Breast Cancer    INTERVAL HISTORY: Patient returns to clinic today for further evaluation and consideration of cycle 3 of 4 of Adriamycin and Cytoxan.  She currently feels well and is asymptomatic. She had some nausea for a few days after cycle 2 but ondansetron worked well and she is not having nausea for the past few days. She does have a moon shaped red spot on her right arm which has been there for many years but has been more red recently. It does not itch and is not bothersome.  She has no neurologic complaints. She denies any recent fevers. She has a good appetite and denies weight loss. She has no chest pain or shortness of breath. She denies any nausea, vomiting, constipation, or diarrhea. She has no urinary complaints. Patient offers no further specific complaints today.  REVIEW OF SYSTEMS:   Review of Systems  Constitutional: Negative for fever, weight loss and malaise/fatigue.  HENT: Negative.   Respiratory: Negative.  Negative for shortness of breath.   Cardiovascular: Negative.  Negative for chest pain.  Gastrointestinal: Negative.  Negative for abdominal pain.  Musculoskeletal: Negative.   Skin: Positive for rash.       Moon shaped red spot on right arm  Neurological: Negative for weakness.  Psychiatric/Behavioral: The patient is not nervous/anxious.     As per HPI. Otherwise, a complete review of systems is negatve.  PAST MEDICAL HISTORY: Past Medical History  Diagnosis Date  . Anxiety   . Migraine   . H/O tubal ligation   . Osteoporosis   . Arthritis   . H/O breast biopsy   . GERD (gastroesophageal reflux disease)     NO MEDS  . Cancer ([place])   . Family history  of adverse reaction to anesthesia     PTS UNCLE   . Tremor     TAKES PROPRANOLOL FOR TREMORS    PAST SURGICAL HISTORY: Past Surgical History  Procedure Laterality Date  . Wrist fracture surgery Right 2014  . Breast biopsy Bilateral 1996    neg  . Breast biopsy Left 2017  . Portacath placement Right 03/02/2015    Procedure: INSERTION PORT-A-CATH;  Surgeon: Leonie Green, MD;  Location: [facility] ORS;  Service: General;  Laterality: Right;    FAMILY HISTORY Family History  Problem Relation Age of Onset  . Hypertension Father   . Cancer Maternal Uncle   . Cancer Paternal Uncle   . Liver disease Paternal Grandfather        ADVANCED DIRECTIVES:    HEALTH MAINTENANCE: Social History  Substance Use Topics  . Smoking status: Never Smoker   . Smokeless tobacco: Not on file  . Alcohol Use: No     Allergies  Allergen Reactions  . Codeine Nausea Only  . Topamax [Topiramate] Other (See Comments)    Lightheaded, feels "out of body"    Current Outpatient Prescriptions  Medication Sig Dispense Refill  . alendronate (FOSAMAX) 70 MG tablet TAKE 1 TABLET BY MOUTH EVERY WEEK    . calcium-vitamin D 250-100 MG-UNIT tablet Take 2 tablets by mouth 2 (two) times daily. Reported on 02/25/2015    . Cholecalciferol (D 1000) 1000 units capsule Take 1,000 Units  by mouth daily.    . Glucosamine Sulfate 500 MG TABS Take by mouth. Reported on 02/25/2015    . lidocaine-prilocaine (EMLA) cream Apply 1 application topically as needed. Apply to port then cover with saran wrap, 1-2 hours before chemotherapy appointment 30 g 2  . Misc Natural Products (OSTEO BI-FLEX ADV DOUBLE ST) TABS Take 1 tablet by mouth daily.    . ondansetron (ZOFRAN) 8 MG tablet Take 1 tablet (8 mg total) by mouth 2 (two) times daily as needed. Start on the third day after chemotherapy. 30 tablet 1  . prochlorperazine (COMPAZINE) 10 MG tablet Take 1 tablet (10 mg total) by mouth every 6 (six) hours as needed (Nausea or  vomiting). 30 tablet 1  . propranolol (INDERAL) 10 MG tablet Take 10 mg by mouth 2 (two) times daily.    Marland Kitchen venlafaxine XR (EFFEXOR-XR) 150 MG 24 hr capsule TAKE ONE CAPSULE BY MOUTH EVERY DAY-PM     No current facility-administered medications for this visit.    OBJECTIVE: Filed Vitals:   04/20/15 0946  BP: 139/83  Pulse: 77  Temp: 97.8 F (36.6 C)  Resp: 18     Body mass index is 26.36 kg/(m^2).    ECOG FS:0 - Asymptomatic  General: Well-developed, well-nourished, no acute distress. Eyes: Pink conjunctiva, anicteric sclera. Lungs: Clear to auscultation bilaterally. Heart: Regular rate and rhythm. No rubs, murmurs, or gallops. Abdomen: Soft, nontender, nondistended. No organomegaly noted, normoactive bowel sounds. Musculoskeletal: No edema, cyanosis, or clubbing. Neuro: Alert, answering all questions appropriately. Cranial nerves grossly intact. Skin: Small area of erythema right arm. Psych: Normal affect.  LAB RESULTS:  Lab Results  Component Value Date   NA 132* 04/20/2015   K 4.2 04/20/2015   CL 103 04/20/2015   CO2 25 04/20/2015   GLUCOSE 94 04/20/2015   BUN 16 04/20/2015   CREATININE 0.66 04/20/2015   CALCIUM 8.1* 04/20/2015   PROT 6.6 04/20/2015   ALBUMIN 4.0 04/20/2015   AST 20 04/20/2015   ALT 17 04/20/2015   ALKPHOS 76 04/20/2015   BILITOT 0.4 04/20/2015   GFRNONAA >60 04/20/2015   GFRAA >60 04/20/2015    Lab Results  Component Value Date   WBC 19.0* 04/20/2015   NEUTROABS 16.1* 04/20/2015   HGB 11.0* 04/20/2015   HCT 32.0* 04/20/2015   MCV 92.2 04/20/2015   PLT 206 04/20/2015     STUDIES: No results found.  ASSESSMENT: Clinical stage IIa ER/PR+, invasive lobular carcinoma of the left breast.  PLAN:    1. Breast cancer: Biopsy of the lesion in the left upper outer quadrant approximately 4 cm away from the initial biopsy site consistent with invasive lobular carcinoma. Proceed with cycle 3 of 4 Adriamycin and Cytoxan today. Patient will also  receive an OnPro Neulasta with her treatments. This will be followed by 12 weeks of Taxol. She may or may not benefit from radiation therapy depending on the type of surgery she chooses. Ultimately, patient will require aromatase inhibitor for a minimum of 5 years. MUGA was denied by insurance, for cardiac echo revealed an EF between 55 and 60%. Return to clinic in 2 weeks for consideration of cycle 4. 2. Headaches: Resolved. Possibly secondary to Neulasta, monitor. 3. Rash: Unclear etiology. Not concerning.  Patient expressed understanding and was in agreement with this plan. She also understands that She can call clinic at any time with any questions, concerns, or complaints.   Breast cancer [facility])   Staging form: Breast, AJCC 7th Edition  Clinical stage from 02/12/2015: Stage IIA (T2, N0, M0) - Signed by Lloyd Huger, MD on 02/12/2015   Mayra Reel, NP   04/20/2015 10:17 AM  Patient was seen and evaluated independently and I agree with the assessment and plan as dictated above.  Lloyd Huger, MD 04/23/2015 5:47 AM

## 2015-05-04 ENCOUNTER — Inpatient Hospital Stay: Payer: Managed Care, Other (non HMO) | Attending: Oncology

## 2015-05-04 ENCOUNTER — Inpatient Hospital Stay (HOSPITAL_BASED_OUTPATIENT_CLINIC_OR_DEPARTMENT_OTHER): Payer: Managed Care, Other (non HMO) | Admitting: Oncology

## 2015-05-04 ENCOUNTER — Inpatient Hospital Stay: Payer: Managed Care, Other (non HMO)

## 2015-05-04 VITALS — BP 111/73 | HR 77 | Temp 98.9°F | Resp 16 | Wt 151.2 lb

## 2015-05-04 DIAGNOSIS — D72829 Elevated white blood cell count, unspecified: Secondary | ICD-10-CM

## 2015-05-04 DIAGNOSIS — K219 Gastro-esophageal reflux disease without esophagitis: Secondary | ICD-10-CM

## 2015-05-04 DIAGNOSIS — R200 Anesthesia of skin: Secondary | ICD-10-CM | POA: Insufficient documentation

## 2015-05-04 DIAGNOSIS — D6481 Anemia due to antineoplastic chemotherapy: Secondary | ICD-10-CM | POA: Diagnosis not present

## 2015-05-04 DIAGNOSIS — C50412 Malignant neoplasm of upper-outer quadrant of left female breast: Secondary | ICD-10-CM | POA: Diagnosis not present

## 2015-05-04 DIAGNOSIS — Z8669 Personal history of other diseases of the nervous system and sense organs: Secondary | ICD-10-CM | POA: Diagnosis not present

## 2015-05-04 DIAGNOSIS — R21 Rash and other nonspecific skin eruption: Secondary | ICD-10-CM | POA: Insufficient documentation

## 2015-05-04 DIAGNOSIS — Z7689 Persons encountering health services in other specified circumstances: Secondary | ICD-10-CM | POA: Insufficient documentation

## 2015-05-04 DIAGNOSIS — R439 Unspecified disturbances of smell and taste: Secondary | ICD-10-CM | POA: Insufficient documentation

## 2015-05-04 DIAGNOSIS — M129 Arthropathy, unspecified: Secondary | ICD-10-CM | POA: Insufficient documentation

## 2015-05-04 DIAGNOSIS — F419 Anxiety disorder, unspecified: Secondary | ICD-10-CM

## 2015-05-04 DIAGNOSIS — C50912 Malignant neoplasm of unspecified site of left female breast: Secondary | ICD-10-CM

## 2015-05-04 DIAGNOSIS — M818 Other osteoporosis without current pathological fracture: Secondary | ICD-10-CM | POA: Diagnosis not present

## 2015-05-04 DIAGNOSIS — R251 Tremor, unspecified: Secondary | ICD-10-CM

## 2015-05-04 DIAGNOSIS — Z79899 Other long term (current) drug therapy: Secondary | ICD-10-CM

## 2015-05-04 DIAGNOSIS — G629 Polyneuropathy, unspecified: Secondary | ICD-10-CM | POA: Insufficient documentation

## 2015-05-04 DIAGNOSIS — R110 Nausea: Secondary | ICD-10-CM

## 2015-05-04 DIAGNOSIS — K5900 Constipation, unspecified: Secondary | ICD-10-CM | POA: Diagnosis not present

## 2015-05-04 DIAGNOSIS — Z809 Family history of malignant neoplasm, unspecified: Secondary | ICD-10-CM | POA: Insufficient documentation

## 2015-05-04 DIAGNOSIS — Z5111 Encounter for antineoplastic chemotherapy: Secondary | ICD-10-CM | POA: Diagnosis not present

## 2015-05-04 DIAGNOSIS — Z170 Estrogen receptor positive status [ER+]: Secondary | ICD-10-CM | POA: Insufficient documentation

## 2015-05-04 LAB — COMPREHENSIVE METABOLIC PANEL
ALBUMIN: 3.7 g/dL (ref 3.5–5.0)
ALT: 14 U/L (ref 14–54)
AST: 19 U/L (ref 15–41)
Alkaline Phosphatase: 78 U/L (ref 38–126)
Anion gap: 4 — ABNORMAL LOW (ref 5–15)
BILIRUBIN TOTAL: 0.4 mg/dL (ref 0.3–1.2)
BUN: 15 mg/dL (ref 6–20)
CALCIUM: 8.4 mg/dL — AB (ref 8.9–10.3)
CHLORIDE: 104 mmol/L (ref 101–111)
CO2: 25 mmol/L (ref 22–32)
CREATININE: 0.65 mg/dL (ref 0.44–1.00)
Glucose, Bld: 85 mg/dL (ref 65–99)
Potassium: 4.1 mmol/L (ref 3.5–5.1)
SODIUM: 133 mmol/L — AB (ref 135–145)
TOTAL PROTEIN: 6.5 g/dL (ref 6.5–8.1)

## 2015-05-04 LAB — CBC WITH DIFFERENTIAL/PLATELET
BASOS PCT: 0 %
Basophils Absolute: 0 10*3/uL (ref 0–0.1)
EOS ABS: 0 10*3/uL (ref 0–0.7)
Eosinophils Relative: 0 %
HCT: 28.5 % — ABNORMAL LOW (ref 35.0–47.0)
Hemoglobin: 9.9 g/dL — ABNORMAL LOW (ref 12.0–16.0)
Lymphocytes Relative: 6 %
Lymphs Abs: 1.3 10*3/uL (ref 1.0–3.6)
MCH: 31.9 pg (ref 26.0–34.0)
MCHC: 34.5 g/dL (ref 32.0–36.0)
MCV: 92.4 fL (ref 80.0–100.0)
MONO ABS: 2 10*3/uL — AB (ref 0.2–0.9)
MONOS PCT: 9 %
Neutro Abs: 18.4 10*3/uL — ABNORMAL HIGH (ref 1.4–6.5)
Neutrophils Relative %: 85 %
Platelets: 232 10*3/uL (ref 150–440)
RBC: 3.09 MIL/uL — ABNORMAL LOW (ref 3.80–5.20)
RDW: 13.1 % (ref 11.5–14.5)
WBC: 21.8 10*3/uL — ABNORMAL HIGH (ref 3.6–11.0)

## 2015-05-04 MED ORDER — SODIUM CHLORIDE 0.9 % IV SOLN
Freq: Once | INTRAVENOUS | Status: AC
  Administered 2015-05-04: 11:00:00 via INTRAVENOUS
  Filled 2015-05-04: qty 5

## 2015-05-04 MED ORDER — SODIUM CHLORIDE 0.9 % IV SOLN
1000.0000 mg | Freq: Once | INTRAVENOUS | Status: AC
  Administered 2015-05-04: 1000 mg via INTRAVENOUS
  Filled 2015-05-04: qty 50

## 2015-05-04 MED ORDER — PALONOSETRON HCL INJECTION 0.25 MG/5ML
0.2500 mg | Freq: Once | INTRAVENOUS | Status: AC
Start: 2015-05-04 — End: 2015-05-04
  Administered 2015-05-04: 0.25 mg via INTRAVENOUS
  Filled 2015-05-04: qty 5

## 2015-05-04 MED ORDER — HEPARIN SOD (PORK) LOCK FLUSH 100 UNIT/ML IV SOLN
500.0000 [IU] | Freq: Once | INTRAVENOUS | Status: AC | PRN
  Administered 2015-05-04: 500 [IU]
  Filled 2015-05-04: qty 5

## 2015-05-04 MED ORDER — SODIUM CHLORIDE 0.9 % IV SOLN
Freq: Once | INTRAVENOUS | Status: AC
  Administered 2015-05-04: 11:00:00 via INTRAVENOUS
  Filled 2015-05-04: qty 1000

## 2015-05-04 MED ORDER — DOXORUBICIN HCL CHEMO IV INJECTION 2 MG/ML
60.0000 mg/m2 | Freq: Once | INTRAVENOUS | Status: AC
Start: 2015-05-04 — End: 2015-05-04
  Administered 2015-05-04: 108 mg via INTRAVENOUS
  Filled 2015-05-04: qty 54

## 2015-05-04 MED ORDER — PEGFILGRASTIM 6 MG/0.6ML ~~LOC~~ PSKT
6.0000 mg | PREFILLED_SYRINGE | Freq: Once | SUBCUTANEOUS | Status: AC
  Administered 2015-05-04: 6 mg via SUBCUTANEOUS
  Filled 2015-05-04: qty 0.6

## 2015-05-04 MED ORDER — SODIUM CHLORIDE 0.9% FLUSH
10.0000 mL | INTRAVENOUS | Status: DC | PRN
  Administered 2015-05-04: 10 mL
  Filled 2015-05-04: qty 10

## 2015-05-04 NOTE — Progress Notes (Signed)
[place]  Telephone:(336) [phone] Fax:(336) [phone]  ID: Marisa Evans OB: 08-27-1955  MR#: [id number]  [id number]  Patient Care Team: Adin Hector, MD as PCP - General (Internal Medicine)  CHIEF COMPLAINT:  Chief Complaint  Patient presents with  . Breast Cancer    INTERVAL HISTORY: Patient returns to clinic today for further evaluation and consideration of cycle 4 of Adriamycin and Cytoxan.  She currently feels well. She had some nausea but ondansetron worked well and she is not having nausea for the past few days. She does have an itchy rash on both her arms and she has used hydrocortisone cream. She has some numbness in her bilateral fingers but it is not painful and it does not interfere with her daily routine. She denies any recent fevers. She has a good appetite although she has altered taste that does interfere with eating. She denies weight loss. She has no chest pain or shortness of breath. She denies any vomiting or diarrhea but does have occasional constipation and she has miralax if she needs it. She has no urinary complaints. Patient offers no further specific complaints today.  REVIEW OF SYSTEMS:   Review of Systems  Constitutional: Negative for fever, weight loss and malaise/fatigue.  HENT: Negative.   Respiratory: Negative.  Negative for shortness of breath.   Cardiovascular: Negative.  Negative for chest pain.  Gastrointestinal: Positive for nausea and constipation. Negative for vomiting and abdominal pain.  Musculoskeletal: Negative.   Skin: Positive for itching and rash.  Neurological: Positive for sensory change. Negative for weakness.  Psychiatric/Behavioral: The patient is not nervous/anxious.     As per HPI. Otherwise, a complete review of systems is negatve.  PAST MEDICAL HISTORY: Past Medical History  Diagnosis Date  . Anxiety   . Migraine   . H/O tubal ligation   . Osteoporosis   . Arthritis   . H/O breast biopsy     . GERD (gastroesophageal reflux disease)     NO MEDS  . Cancer ([place])   . Family history of adverse reaction to anesthesia     PTS UNCLE   . Tremor     TAKES PROPRANOLOL FOR TREMORS    PAST SURGICAL HISTORY: Past Surgical History  Procedure Laterality Date  . Wrist fracture surgery Right 2014  . Breast biopsy Bilateral 1996    neg  . Breast biopsy Left 2017  . Portacath placement Right 03/02/2015    Procedure: INSERTION PORT-A-CATH;  Surgeon: Leonie Green, MD;  Location: [facility] ORS;  Service: General;  Laterality: Right;    FAMILY HISTORY Family History  Problem Relation Age of Onset  . Hypertension Father   . Cancer Maternal Uncle   . Cancer Paternal Uncle   . Liver disease Paternal Grandfather        ADVANCED DIRECTIVES:    HEALTH MAINTENANCE: Social History  Substance Use Topics  . Smoking status: Never Smoker   . Smokeless tobacco: Not on file  . Alcohol Use: No     Allergies  Allergen Reactions  . Codeine Nausea Only  . Topamax [Topiramate] Other (See Comments)    Lightheaded, feels "out of body"    Current Outpatient Prescriptions  Medication Sig Dispense Refill  . alendronate (FOSAMAX) 70 MG tablet TAKE 1 TABLET BY MOUTH EVERY WEEK    . calcium-vitamin D 250-100 MG-UNIT tablet Take 2 tablets by mouth 2 (two) times daily. Reported on 02/25/2015    . Cholecalciferol (D 1000) 1000 units capsule  Take 1,000 Units by mouth daily.    . Glucosamine Sulfate 500 MG TABS Take by mouth. Reported on 02/25/2015    . lidocaine-prilocaine (EMLA) cream Apply 1 application topically as needed. Apply to port then cover with saran wrap, 1-2 hours before chemotherapy appointment 30 g 2  . Misc Natural Products (OSTEO BI-FLEX ADV DOUBLE ST) TABS Take 1 tablet by mouth daily.    . ondansetron (ZOFRAN) 8 MG tablet Take 1 tablet (8 mg total) by mouth 2 (two) times daily as needed. Start on the third day after chemotherapy. 30 tablet 1  . prochlorperazine (COMPAZINE) 10 MG  tablet Take 1 tablet (10 mg total) by mouth every 6 (six) hours as needed (Nausea or vomiting). 30 tablet 1  . propranolol (INDERAL) 10 MG tablet Take 10 mg by mouth 2 (two) times daily.    Marland Kitchen venlafaxine XR (EFFEXOR-XR) 150 MG 24 hr capsule TAKE ONE CAPSULE BY MOUTH EVERY DAY-PM     No current facility-administered medications for this visit.    OBJECTIVE: Filed Vitals:   05/04/15 0951  BP: 111/73  Pulse: 77  Temp: 98.9 F (37.2 C)  Resp: 16     Body mass index is 25.95 kg/(m^2).    ECOG FS:0 - Asymptomatic  General: Well-developed, well-nourished, no acute distress. Eyes: Pink conjunctiva, anicteric sclera. Lungs: Clear to auscultation bilaterally. Heart: Regular rate and rhythm. No rubs, murmurs, or gallops. Abdomen: Soft, nontender, nondistended. No organomegaly noted, normoactive bowel sounds. Musculoskeletal: No edema, cyanosis, or clubbing. Neuro: Alert, answering all questions appropriately. Cranial nerves grossly intact. Skin: Itchy dry skin rash, no erythema Psych: Normal affect.  LAB RESULTS:  Lab Results  Component Value Date   NA 133* 05/04/2015   K 4.1 05/04/2015   CL 104 05/04/2015   CO2 25 05/04/2015   GLUCOSE 85 05/04/2015   BUN 15 05/04/2015   CREATININE 0.65 05/04/2015   CALCIUM 8.4* 05/04/2015   PROT 6.5 05/04/2015   ALBUMIN 3.7 05/04/2015   AST 19 05/04/2015   ALT 14 05/04/2015   ALKPHOS 78 05/04/2015   BILITOT 0.4 05/04/2015   GFRNONAA >60 05/04/2015   GFRAA >60 05/04/2015    Lab Results  Component Value Date   WBC 21.8* 05/04/2015   NEUTROABS 18.4* 05/04/2015   HGB 9.9* 05/04/2015   HCT 28.5* 05/04/2015   MCV 92.4 05/04/2015   PLT 232 05/04/2015     STUDIES: No results found.  ASSESSMENT: Clinical stage IIa ER/PR+, invasive lobular carcinoma of the left breast.  PLAN:    1. Breast cancer: Biopsy of the lesion in the left upper outer quadrant approximately 4 cm away from the initial biopsy site consistent with invasive lobular  carcinoma. Proceed with cycle 4 Adriamycin and Cytoxan today. Patient will also receive an OnPro Neulasta with her treatments. This will be followed by 12 weeks of Taxol. She may or may not benefit from radiation therapy depending on the type of surgery she chooses. Ultimately, patient will require aromatase inhibitor for a minimum of 5 years. MUGA was denied by insurance, for cardiac echo revealed an EF between 55 and 60%. Return to clinic in 2 weeks for consideration of cycle 1 Taxol.  2. Rash: Unclear etiology. OTC benadryl or hydrocortisone PRN. 3. Constipation: Controlled with Miralax PRN 4. Nausea: Controlled with Ondansetron PRN. 5. Anemia: HGB 9.9 today. Secondary to chemotherapy. Monitor. 6. Neuropathy: Monitor. 7. Taste disturbance: Oral intake as tolerated. Secondary to chemotherapy. 8. Leukocytosis: Likely secondary to Neulasta, monitor.   Patient expressed  understanding and was in agreement with this plan. She also understands that She can call clinic at any time with any questions, concerns, or complaints.   Breast cancer [facility])   Staging form: Breast, AJCC 7th Edition     Clinical stage from 02/12/2015: Stage IIA (T2, N0, M0) - Signed by Lloyd Huger, MD on 02/12/2015   Mayra Reel, NP   05/04/2015 10:34 AM  Patient was seen and evaluated independently and I agree with the assessment and plan as dictated above.  Lloyd Huger, MD 05/06/2015 11:11 PM

## 2015-05-04 NOTE — Progress Notes (Signed)
After last treatment patient did have an increase in nausea with change in taste that caused a decrease in her appetite.  She has a spot on her left forearm that has become irritated, red, and itchy since starting treatment.  Does have occasional neuropathy with the last episode in her left hand last night.

## 2015-05-18 ENCOUNTER — Inpatient Hospital Stay (HOSPITAL_BASED_OUTPATIENT_CLINIC_OR_DEPARTMENT_OTHER): Payer: Managed Care, Other (non HMO) | Admitting: Oncology

## 2015-05-18 ENCOUNTER — Inpatient Hospital Stay: Payer: Managed Care, Other (non HMO)

## 2015-05-18 VITALS — BP 107/72 | HR 83 | Temp 98.8°F | Resp 12 | Wt 151.7 lb

## 2015-05-18 VITALS — BP 124/79 | HR 69 | Resp 16

## 2015-05-18 DIAGNOSIS — R200 Anesthesia of skin: Secondary | ICD-10-CM

## 2015-05-18 DIAGNOSIS — R21 Rash and other nonspecific skin eruption: Secondary | ICD-10-CM

## 2015-05-18 DIAGNOSIS — M818 Other osteoporosis without current pathological fracture: Secondary | ICD-10-CM

## 2015-05-18 DIAGNOSIS — Z170 Estrogen receptor positive status [ER+]: Secondary | ICD-10-CM

## 2015-05-18 DIAGNOSIS — R439 Unspecified disturbances of smell and taste: Secondary | ICD-10-CM

## 2015-05-18 DIAGNOSIS — R251 Tremor, unspecified: Secondary | ICD-10-CM

## 2015-05-18 DIAGNOSIS — G629 Polyneuropathy, unspecified: Secondary | ICD-10-CM

## 2015-05-18 DIAGNOSIS — C50912 Malignant neoplasm of unspecified site of left female breast: Secondary | ICD-10-CM

## 2015-05-18 DIAGNOSIS — Z79899 Other long term (current) drug therapy: Secondary | ICD-10-CM

## 2015-05-18 DIAGNOSIS — Z7689 Persons encountering health services in other specified circumstances: Secondary | ICD-10-CM | POA: Diagnosis not present

## 2015-05-18 DIAGNOSIS — K5900 Constipation, unspecified: Secondary | ICD-10-CM

## 2015-05-18 DIAGNOSIS — Z809 Family history of malignant neoplasm, unspecified: Secondary | ICD-10-CM

## 2015-05-18 DIAGNOSIS — R110 Nausea: Secondary | ICD-10-CM

## 2015-05-18 DIAGNOSIS — F419 Anxiety disorder, unspecified: Secondary | ICD-10-CM

## 2015-05-18 DIAGNOSIS — M129 Arthropathy, unspecified: Secondary | ICD-10-CM

## 2015-05-18 DIAGNOSIS — D72829 Elevated white blood cell count, unspecified: Secondary | ICD-10-CM

## 2015-05-18 DIAGNOSIS — C50412 Malignant neoplasm of upper-outer quadrant of left female breast: Secondary | ICD-10-CM | POA: Diagnosis not present

## 2015-05-18 DIAGNOSIS — D6481 Anemia due to antineoplastic chemotherapy: Secondary | ICD-10-CM

## 2015-05-18 DIAGNOSIS — K219 Gastro-esophageal reflux disease without esophagitis: Secondary | ICD-10-CM

## 2015-05-18 DIAGNOSIS — Z8669 Personal history of other diseases of the nervous system and sense organs: Secondary | ICD-10-CM

## 2015-05-18 LAB — CBC WITH DIFFERENTIAL/PLATELET
BASOS ABS: 0 10*3/uL (ref 0–0.1)
BASOS PCT: 0 %
Band Neutrophils: 12 %
EOS ABS: 0 10*3/uL (ref 0–0.7)
EOS PCT: 0 %
HEMATOCRIT: 26.5 % — AB (ref 35.0–47.0)
Hemoglobin: 9.1 g/dL — ABNORMAL LOW (ref 12.0–16.0)
LYMPHS ABS: 1 10*3/uL (ref 1.0–3.6)
Lymphocytes Relative: 4 %
MCH: 32.1 pg (ref 26.0–34.0)
MCHC: 34.3 g/dL (ref 32.0–36.0)
MCV: 93.6 fL (ref 80.0–100.0)
METAMYELOCYTES PCT: 3 %
Monocytes Absolute: 2.2 10*3/uL — ABNORMAL HIGH (ref 0.2–0.9)
Monocytes Relative: 9 %
Myelocytes: 1 %
NEUTROS ABS: 21.4 10*3/uL — AB (ref 1.4–6.5)
NEUTROS PCT: 71 %
Platelets: 183 10*3/uL (ref 150–440)
RBC: 2.83 MIL/uL — ABNORMAL LOW (ref 3.80–5.20)
RDW: 15 % — AB (ref 11.5–14.5)
WBC: 24.6 10*3/uL — AB (ref 3.6–11.0)
nRBC: 2 /100 WBC — ABNORMAL HIGH

## 2015-05-18 LAB — COMPREHENSIVE METABOLIC PANEL
ALT: 15 U/L (ref 14–54)
AST: 21 U/L (ref 15–41)
Albumin: 3.8 g/dL (ref 3.5–5.0)
Alkaline Phosphatase: 82 U/L (ref 38–126)
Anion gap: 2 — ABNORMAL LOW (ref 5–15)
BILIRUBIN TOTAL: 0.2 mg/dL — AB (ref 0.3–1.2)
BUN: 12 mg/dL (ref 6–20)
CO2: 26 mmol/L (ref 22–32)
CREATININE: 0.65 mg/dL (ref 0.44–1.00)
Calcium: 8.6 mg/dL — ABNORMAL LOW (ref 8.9–10.3)
Chloride: 107 mmol/L (ref 101–111)
Glucose, Bld: 109 mg/dL — ABNORMAL HIGH (ref 65–99)
POTASSIUM: 4 mmol/L (ref 3.5–5.1)
Sodium: 135 mmol/L (ref 135–145)
TOTAL PROTEIN: 6.3 g/dL — AB (ref 6.5–8.1)

## 2015-05-18 MED ORDER — SODIUM CHLORIDE 0.9 % IV SOLN
10.0000 mg | Freq: Once | INTRAVENOUS | Status: AC
  Administered 2015-05-18: 10 mg via INTRAVENOUS
  Filled 2015-05-18: qty 1

## 2015-05-18 MED ORDER — HEPARIN SOD (PORK) LOCK FLUSH 100 UNIT/ML IV SOLN
500.0000 [IU] | Freq: Once | INTRAVENOUS | Status: AC
  Administered 2015-05-18: 500 [IU] via INTRAVENOUS

## 2015-05-18 MED ORDER — FAMOTIDINE IN NACL 20-0.9 MG/50ML-% IV SOLN
20.0000 mg | Freq: Once | INTRAVENOUS | Status: AC
  Administered 2015-05-18: 20 mg via INTRAVENOUS
  Filled 2015-05-18: qty 50

## 2015-05-18 MED ORDER — SODIUM CHLORIDE 0.9% FLUSH
10.0000 mL | INTRAVENOUS | Status: DC | PRN
  Administered 2015-05-18: 10 mL via INTRAVENOUS
  Filled 2015-05-18: qty 10

## 2015-05-18 MED ORDER — DIPHENHYDRAMINE HCL 50 MG/ML IJ SOLN
25.0000 mg | Freq: Once | INTRAMUSCULAR | Status: AC
  Administered 2015-05-18: 25 mg via INTRAVENOUS
  Filled 2015-05-18: qty 1

## 2015-05-18 MED ORDER — PACLITAXEL CHEMO INJECTION 300 MG/50ML
80.0000 mg/m2 | Freq: Once | INTRAVENOUS | Status: AC
  Administered 2015-05-18: 138 mg via INTRAVENOUS
  Filled 2015-05-18: qty 23

## 2015-05-18 MED ORDER — SODIUM CHLORIDE 0.9 % IV SOLN
Freq: Once | INTRAVENOUS | Status: AC
  Administered 2015-05-18: 10:00:00 via INTRAVENOUS
  Filled 2015-05-18: qty 1000

## 2015-05-18 NOTE — Progress Notes (Signed)
[place]  Telephone:(336) [phone] Fax:(336) [phone]  ID: LEE-ANN CAVAZOS OB: 10-16-1955  MR#: [id number]  [id number]  Patient Care Team: Adin Hector, MD as PCP - General (Internal Medicine)  CHIEF COMPLAINT:  Chief Complaint  Patient presents with  . Breast Cancer    INTERVAL HISTORY: Patient returns to clinic today for further evaluation and consideration of cycle 1 of weekly taxol.  She currently feels well. She has a good appetite and no more nausea. The rash is better with hydrocortisone cream and does not itch. She still has some numbness in her fingers but it comes and goes and is not painful. She has no pain. She denies any recent fevers. She denies weight loss. She has no chest pain or shortness of breath. She denies any vomiting or diarrhea but does have occasional constipation and she has miralax if she needs it. She has no urinary complaints. Patient offers no further specific complaints today.  REVIEW OF SYSTEMS:   Review of Systems  Constitutional: Negative for fever, weight loss and malaise/fatigue.  HENT: Negative.   Respiratory: Negative.  Negative for shortness of breath.   Cardiovascular: Negative.  Negative for chest pain.  Gastrointestinal: Positive for constipation. Negative for nausea, vomiting and abdominal pain.  Musculoskeletal: Negative.   Skin: Negative for itching.  Neurological: Positive for sensory change. Negative for weakness.  Psychiatric/Behavioral: The patient is not nervous/anxious.     As per HPI. Otherwise, a complete review of systems is negatve.  PAST MEDICAL HISTORY: Past Medical History  Diagnosis Date  . Anxiety   . Migraine   . H/O tubal ligation   . Osteoporosis   . Arthritis   . H/O breast biopsy   . GERD (gastroesophageal reflux disease)     NO MEDS  . Cancer ([place])   . Family history of adverse reaction to anesthesia     PTS UNCLE   . Tremor     TAKES PROPRANOLOL FOR TREMORS    PAST  SURGICAL HISTORY: Past Surgical History  Procedure Laterality Date  . Wrist fracture surgery Right 2014  . Breast biopsy Bilateral 1996    neg  . Breast biopsy Left 2017  . Portacath placement Right 03/02/2015    Procedure: INSERTION PORT-A-CATH;  Surgeon: Leonie Green, MD;  Location: [facility] ORS;  Service: General;  Laterality: Right;    FAMILY HISTORY Family History  Problem Relation Age of Onset  . Hypertension Father   . Cancer Maternal Uncle   . Cancer Paternal Uncle   . Liver disease Paternal Grandfather        ADVANCED DIRECTIVES:    HEALTH MAINTENANCE: Social History  Substance Use Topics  . Smoking status: Never Smoker   . Smokeless tobacco: Not on file  . Alcohol Use: No     Allergies  Allergen Reactions  . Codeine Nausea Only  . Topamax [Topiramate] Other (See Comments)    Lightheaded, feels "out of body"    Current Outpatient Prescriptions  Medication Sig Dispense Refill  . alendronate (FOSAMAX) 70 MG tablet TAKE 1 TABLET BY MOUTH EVERY WEEK    . calcium-vitamin D 250-100 MG-UNIT tablet Take 1 tablet by mouth 2 (two) times daily. Reported on 02/25/2015    . Cholecalciferol (D 1000) 1000 units capsule Take 1,000 Units by mouth daily.    . Glucosamine Sulfate 500 MG TABS Take by mouth. Reported on 02/25/2015    . lidocaine-prilocaine (EMLA) cream Apply 1 application topically as needed. Apply to  port then cover with saran wrap, 1-2 hours before chemotherapy appointment 30 g 2  . Misc Natural Products (OSTEO BI-FLEX ADV DOUBLE ST) TABS Take 1 tablet by mouth daily.    . propranolol (INDERAL) 10 MG tablet Take 10 mg by mouth 2 (two) times daily.    Marland Kitchen venlafaxine XR (EFFEXOR-XR) 150 MG 24 hr capsule TAKE ONE CAPSULE BY MOUTH EVERY DAY-PM     No current facility-administered medications for this visit.    OBJECTIVE: Filed Vitals:   05/18/15 0908  BP: 107/72  Pulse: 83  Temp: 98.8 F (37.1 C)  Resp: 12     Body mass index is 26.02 kg/(m^2).    ECOG  FS:0 - Asymptomatic  General: Well-developed, well-nourished, no acute distress. Eyes: Pink conjunctiva, anicteric sclera. Lungs: Clear to auscultation bilaterally. Heart: Regular rate and rhythm. No rubs, murmurs, or gallops. Abdomen: Soft, nontender, nondistended. No organomegaly noted, normoactive bowel sounds. Musculoskeletal: No edema, cyanosis, or clubbing. Neuro: Alert, answering all questions appropriately. Cranial nerves grossly intact. Skin: Itchy dry skin rash, no erythema Psych: Normal affect.  LAB RESULTS:  Lab Results  Component Value Date   NA 135 05/18/2015   K 4.0 05/18/2015   CL 107 05/18/2015   CO2 26 05/18/2015   GLUCOSE 109* 05/18/2015   BUN 12 05/18/2015   CREATININE 0.65 05/18/2015   CALCIUM 8.6* 05/18/2015   PROT 6.3* 05/18/2015   ALBUMIN 3.8 05/18/2015   AST 21 05/18/2015   ALT 15 05/18/2015   ALKPHOS 82 05/18/2015   BILITOT 0.2* 05/18/2015   GFRNONAA >60 05/18/2015   GFRAA >60 05/18/2015    Lab Results  Component Value Date   WBC 24.6* 05/18/2015   NEUTROABS PENDING 05/18/2015   HGB 9.1* 05/18/2015   HCT 26.5* 05/18/2015   MCV 93.6 05/18/2015   PLT 183 05/18/2015     STUDIES: No results found.  ASSESSMENT: Clinical stage IIa ER/PR+, invasive lobular carcinoma of the left breast.  PLAN:    1. Breast cancer: Biopsy of the lesion in the left upper outer quadrant approximately 4 cm away from the initial biopsy site consistent with invasive lobular carcinoma. Proceed with cycle 1 of Taxol today. She will no longer require neulasta. She may or may not benefit from radiation therapy depending on the type of surgery she chooses. Ultimately, patient will require aromatase inhibitor for a minimum of 5 years. MUGA was denied by insurance, for cardiac echo revealed an EF between 55 and 60%. We will see her every other week while on taxol.  Return to clinic in 1 week for labs and cycle 2 of Taxol and then in 2 weeks for labs, MD and cycle 3 Taxol.     2. Rash: Unclear etiology. OTC benadryl or hydrocortisone PRN. 3. Constipation: Controlled with Miralax PRN 4. Nausea: Resolved. Ondansetron PRN. 5. Anemia: HGB 9.1 today. Secondary to chemotherapy. Monitor. 6. Neuropathy: Monitor. 7. Hypocalcemia: OTC calcium BID  Patient expressed understanding and was in agreement with this plan. She also understands that She can call clinic at any time with any questions, concerns, or complaints.   Breast cancer [facility])   Staging form: Breast, AJCC 7th Edition     Clinical stage from 02/12/2015: Stage IIA (T2, N0, M0) - Signed by Lloyd Huger, MD on 02/12/2015   Mayra Reel, NP   05/18/2015 9:47 AM  Patient was seen and evaluated independently and I agree with the assessment and plan as dictated above.  Lloyd Huger, MD 05/19/2015 9:02 AM

## 2015-05-18 NOTE — Progress Notes (Signed)
Patient has been feeling well for the past week with a good appetite and no nausea.  She did increase in calcium to 1 BID.

## 2015-05-25 ENCOUNTER — Inpatient Hospital Stay: Payer: Managed Care, Other (non HMO)

## 2015-05-25 VITALS — BP 106/71 | HR 80 | Temp 98.0°F | Resp 18

## 2015-05-25 DIAGNOSIS — C50912 Malignant neoplasm of unspecified site of left female breast: Secondary | ICD-10-CM

## 2015-05-25 DIAGNOSIS — C50412 Malignant neoplasm of upper-outer quadrant of left female breast: Secondary | ICD-10-CM | POA: Diagnosis not present

## 2015-05-25 LAB — CBC WITH DIFFERENTIAL/PLATELET
BASOS ABS: 0.1 10*3/uL (ref 0–0.1)
BASOS PCT: 1 %
Eosinophils Absolute: 0 10*3/uL (ref 0–0.7)
Eosinophils Relative: 1 %
HEMATOCRIT: 26.4 % — AB (ref 35.0–47.0)
HEMOGLOBIN: 9.4 g/dL — AB (ref 12.0–16.0)
Lymphocytes Relative: 8 %
Lymphs Abs: 0.5 10*3/uL — ABNORMAL LOW (ref 1.0–3.6)
MCH: 33.4 pg (ref 26.0–34.0)
MCHC: 35.6 g/dL (ref 32.0–36.0)
MCV: 93.6 fL (ref 80.0–100.0)
Monocytes Absolute: 1.1 10*3/uL — ABNORMAL HIGH (ref 0.2–0.9)
Monocytes Relative: 16 %
NEUTROS ABS: 5 10*3/uL (ref 1.4–6.5)
NEUTROS PCT: 74 %
Platelets: 413 10*3/uL (ref 150–440)
RBC: 2.82 MIL/uL — AB (ref 3.80–5.20)
RDW: 17.8 % — AB (ref 11.5–14.5)
WBC: 6.7 10*3/uL (ref 3.6–11.0)

## 2015-05-25 LAB — COMPREHENSIVE METABOLIC PANEL
ALBUMIN: 4.1 g/dL (ref 3.5–5.0)
ALK PHOS: 57 U/L (ref 38–126)
ALT: 25 U/L (ref 14–54)
AST: 27 U/L (ref 15–41)
Anion gap: 0 — ABNORMAL LOW (ref 5–15)
BILIRUBIN TOTAL: 0.5 mg/dL (ref 0.3–1.2)
BUN: 14 mg/dL (ref 6–20)
CO2: 27 mmol/L (ref 22–32)
Calcium: 8.9 mg/dL (ref 8.9–10.3)
Chloride: 106 mmol/L (ref 101–111)
Creatinine, Ser: 0.66 mg/dL (ref 0.44–1.00)
GFR calc Af Amer: >60 mL/min (ref >60)
GFR calc non Af Amer: >60 mL/min (ref >60)
GLUCOSE: 92 mg/dL (ref 65–99)
POTASSIUM: 4.3 mmol/L (ref 3.5–5.1)
Sodium: 133 mmol/L — ABNORMAL LOW (ref 135–145)
TOTAL PROTEIN: 6.6 g/dL (ref 6.5–8.1)

## 2015-05-25 MED ORDER — SODIUM CHLORIDE 0.9% FLUSH
10.0000 mL | INTRAVENOUS | Status: DC | PRN
  Administered 2015-05-25 (×2): 10 mL
  Filled 2015-05-25: qty 10

## 2015-05-25 MED ORDER — FAMOTIDINE IN NACL 20-0.9 MG/50ML-% IV SOLN
20.0000 mg | Freq: Once | INTRAVENOUS | Status: AC
  Administered 2015-05-25: 20 mg via INTRAVENOUS
  Filled 2015-05-25: qty 50

## 2015-05-25 MED ORDER — HEPARIN SOD (PORK) LOCK FLUSH 100 UNIT/ML IV SOLN
500.0000 [IU] | Freq: Once | INTRAVENOUS | Status: AC | PRN
  Administered 2015-05-25: 500 [IU]
  Filled 2015-05-25: qty 5

## 2015-05-25 MED ORDER — SODIUM CHLORIDE 0.9 % IV SOLN
10.0000 mg | Freq: Once | INTRAVENOUS | Status: AC
  Administered 2015-05-25: 10 mg via INTRAVENOUS
  Filled 2015-05-25: qty 1

## 2015-05-25 MED ORDER — DIPHENHYDRAMINE HCL 50 MG/ML IJ SOLN
25.0000 mg | Freq: Once | INTRAMUSCULAR | Status: AC
Start: 2015-05-25 — End: 2015-05-25
  Administered 2015-05-25: 25 mg via INTRAVENOUS
  Filled 2015-05-25: qty 1

## 2015-05-25 MED ORDER — SODIUM CHLORIDE 0.9 % IV SOLN
Freq: Once | INTRAVENOUS | Status: AC
  Administered 2015-05-25: 11:00:00 via INTRAVENOUS
  Filled 2015-05-25: qty 1000

## 2015-05-25 MED ORDER — SODIUM CHLORIDE 0.9 % IV SOLN
80.0000 mg/m2 | Freq: Once | INTRAVENOUS | Status: AC
  Administered 2015-05-25: 138 mg via INTRAVENOUS
  Filled 2015-05-25: qty 23

## 2015-06-01 ENCOUNTER — Inpatient Hospital Stay: Payer: Managed Care, Other (non HMO)

## 2015-06-01 ENCOUNTER — Inpatient Hospital Stay: Payer: Managed Care, Other (non HMO) | Attending: Oncology

## 2015-06-01 ENCOUNTER — Inpatient Hospital Stay (HOSPITAL_BASED_OUTPATIENT_CLINIC_OR_DEPARTMENT_OTHER): Payer: Managed Care, Other (non HMO) | Admitting: Oncology

## 2015-06-01 VITALS — BP 122/79 | HR 80 | Temp 98.9°F | Resp 14 | Wt 149.3 lb

## 2015-06-01 DIAGNOSIS — K219 Gastro-esophageal reflux disease without esophagitis: Secondary | ICD-10-CM | POA: Insufficient documentation

## 2015-06-01 DIAGNOSIS — M129 Arthropathy, unspecified: Secondary | ICD-10-CM

## 2015-06-01 DIAGNOSIS — R200 Anesthesia of skin: Secondary | ICD-10-CM | POA: Insufficient documentation

## 2015-06-01 DIAGNOSIS — C50912 Malignant neoplasm of unspecified site of left female breast: Secondary | ICD-10-CM | POA: Insufficient documentation

## 2015-06-01 DIAGNOSIS — T451X5S Adverse effect of antineoplastic and immunosuppressive drugs, sequela: Secondary | ICD-10-CM

## 2015-06-01 DIAGNOSIS — R251 Tremor, unspecified: Secondary | ICD-10-CM | POA: Insufficient documentation

## 2015-06-01 DIAGNOSIS — K5900 Constipation, unspecified: Secondary | ICD-10-CM | POA: Diagnosis not present

## 2015-06-01 DIAGNOSIS — Z5111 Encounter for antineoplastic chemotherapy: Secondary | ICD-10-CM | POA: Diagnosis not present

## 2015-06-01 DIAGNOSIS — Z170 Estrogen receptor positive status [ER+]: Secondary | ICD-10-CM

## 2015-06-01 DIAGNOSIS — Z809 Family history of malignant neoplasm, unspecified: Secondary | ICD-10-CM

## 2015-06-01 DIAGNOSIS — G629 Polyneuropathy, unspecified: Secondary | ICD-10-CM | POA: Diagnosis not present

## 2015-06-01 DIAGNOSIS — Z8669 Personal history of other diseases of the nervous system and sense organs: Secondary | ICD-10-CM | POA: Insufficient documentation

## 2015-06-01 DIAGNOSIS — D6481 Anemia due to antineoplastic chemotherapy: Secondary | ICD-10-CM | POA: Diagnosis not present

## 2015-06-01 DIAGNOSIS — F419 Anxiety disorder, unspecified: Secondary | ICD-10-CM | POA: Diagnosis not present

## 2015-06-01 DIAGNOSIS — Z79899 Other long term (current) drug therapy: Secondary | ICD-10-CM

## 2015-06-01 DIAGNOSIS — M818 Other osteoporosis without current pathological fracture: Secondary | ICD-10-CM | POA: Insufficient documentation

## 2015-06-01 LAB — CBC WITH DIFFERENTIAL/PLATELET
BASOS PCT: 2 %
Basophils Absolute: 0.1 10*3/uL (ref 0–0.1)
EOS ABS: 0.1 10*3/uL (ref 0–0.7)
EOS PCT: 1 %
HCT: 26.2 % — ABNORMAL LOW (ref 35.0–47.0)
Hemoglobin: 9.3 g/dL — ABNORMAL LOW (ref 12.0–16.0)
LYMPHS ABS: 0.5 10*3/uL — AB (ref 1.0–3.6)
Lymphocytes Relative: 10 %
MCH: 34.1 pg — AB (ref 26.0–34.0)
MCHC: 35.5 g/dL (ref 32.0–36.0)
MCV: 96 fL (ref 80.0–100.0)
Monocytes Absolute: 1 10*3/uL — ABNORMAL HIGH (ref 0.2–0.9)
Monocytes Relative: 23 %
Neutro Abs: 2.9 10*3/uL (ref 1.4–6.5)
Neutrophils Relative %: 64 %
PLATELETS: 297 10*3/uL (ref 150–440)
RBC: 2.73 MIL/uL — AB (ref 3.80–5.20)
RDW: 19.4 % — ABNORMAL HIGH (ref 11.5–14.5)
WBC: 4.5 10*3/uL (ref 3.6–11.0)

## 2015-06-01 LAB — COMPREHENSIVE METABOLIC PANEL
ALBUMIN: 3.8 g/dL (ref 3.5–5.0)
ALT: 24 U/L (ref 14–54)
ANION GAP: 6 (ref 5–15)
AST: 26 U/L (ref 15–41)
Alkaline Phosphatase: 50 U/L (ref 38–126)
BUN: 13 mg/dL (ref 6–20)
CHLORIDE: 105 mmol/L (ref 101–111)
CO2: 25 mmol/L (ref 22–32)
Calcium: 9.1 mg/dL (ref 8.9–10.3)
Creatinine, Ser: 0.63 mg/dL (ref 0.44–1.00)
GFR calc non Af Amer: >60 mL/min (ref >60)
GLUCOSE: 92 mg/dL (ref 65–99)
Potassium: 4.3 mmol/L (ref 3.5–5.1)
SODIUM: 136 mmol/L (ref 135–145)
Total Bilirubin: 0.3 mg/dL (ref 0.3–1.2)
Total Protein: 6.4 g/dL — ABNORMAL LOW (ref 6.5–8.1)

## 2015-06-01 MED ORDER — DIPHENHYDRAMINE HCL 50 MG/ML IJ SOLN
25.0000 mg | Freq: Once | INTRAMUSCULAR | Status: AC
  Administered 2015-06-01: 25 mg via INTRAVENOUS
  Filled 2015-06-01: qty 1

## 2015-06-01 MED ORDER — PACLITAXEL CHEMO INJECTION 300 MG/50ML: 80.0000 mg/m2 | Freq: Once | INTRAVENOUS | Status: DC

## 2015-06-01 MED ORDER — HEPARIN SOD (PORK) LOCK FLUSH 100 UNIT/ML IV SOLN
500.0000 [IU] | Freq: Once | INTRAVENOUS | Status: AC | PRN
  Administered 2015-06-01: 500 [IU]
  Filled 2015-06-01: qty 5

## 2015-06-01 MED ORDER — FAMOTIDINE IN NACL 20-0.9 MG/50ML-% IV SOLN
20.0000 mg | Freq: Once | INTRAVENOUS | Status: AC
  Administered 2015-06-01: 20 mg via INTRAVENOUS
  Filled 2015-06-01: qty 50

## 2015-06-01 MED ORDER — SODIUM CHLORIDE 0.9% FLUSH
10.0000 mL | INTRAVENOUS | Status: DC | PRN
  Administered 2015-06-01: 10 mL
  Filled 2015-06-01: qty 10

## 2015-06-01 MED ORDER — SODIUM CHLORIDE 0.9 % IV SOLN
10.0000 mg | Freq: Once | INTRAVENOUS | Status: AC
  Administered 2015-06-01: 10 mg via INTRAVENOUS
  Filled 2015-06-01: qty 1

## 2015-06-01 MED ORDER — SODIUM CHLORIDE 0.9 % IV SOLN
Freq: Once | INTRAVENOUS | Status: AC
  Administered 2015-06-01: 11:00:00 via INTRAVENOUS
  Filled 2015-06-01: qty 1000

## 2015-06-01 MED ORDER — SODIUM CHLORIDE 0.9 % IV SOLN
80.0000 mg/m2 | Freq: Once | INTRAVENOUS | Status: AC
  Administered 2015-06-01: 138 mg via INTRAVENOUS
  Filled 2015-06-01: qty 23

## 2015-06-01 NOTE — Progress Notes (Signed)
Patient does have neuropathy in fingers.

## 2015-06-01 NOTE — Progress Notes (Signed)
[place]  Telephone:(336) [phone] Fax:(336) [phone]  ID: Marisa Evans OB: [birth year]  MR#: [id number]  [id number]  Patient Care Team: Adin Hector, MD as PCP - General (Internal Medicine)  CHIEF COMPLAINT:  Chief Complaint  Patient presents with  . Breast Cancer    INTERVAL HISTORY: Patient returns to clinic today for further evaluation and consideration of cycle 2 of weekly taxol.  She currently feels well. She has a good appetite and denies weight loss. She still has some numbness in her fingers but it comes and goes and is not painful. She has no pain. She denies any recent fevers.  She has no chest pain or shortness of breath. She denies any nausea, vomiting or diarrhea but does have occasional constipation and she has miralax if she needs it. She has no urinary complaints. Patient offers no further specific complaints today.  REVIEW OF SYSTEMS:   Review of Systems  Constitutional: Negative for fever, weight loss and malaise/fatigue.  HENT: Negative.   Respiratory: Negative.  Negative for shortness of breath.   Cardiovascular: Negative.  Negative for chest pain.  Gastrointestinal: Positive for constipation. Negative for nausea, vomiting and abdominal pain.  Musculoskeletal: Negative.   Skin: Negative for itching.  Neurological: Positive for sensory change. Negative for weakness.  Psychiatric/Behavioral: The patient is not nervous/anxious.     As per HPI. Otherwise, a complete review of systems is negatve.  PAST MEDICAL HISTORY: Past Medical History  Diagnosis Date  . Anxiety   . Migraine   . H/O tubal ligation   . Osteoporosis   . Arthritis   . H/O breast biopsy   . GERD (gastroesophageal reflux disease)     NO MEDS  . Cancer ([place])   . Family history of adverse reaction to anesthesia     PTS UNCLE   . Tremor     TAKES PROPRANOLOL FOR TREMORS    PAST SURGICAL HISTORY: Past Surgical History  Procedure Laterality Date  . Wrist  fracture surgery Right 2014  . Breast biopsy Bilateral 1996    neg  . Breast biopsy Left 2017  . Portacath placement Right 03/02/2015    Procedure: INSERTION PORT-A-CATH;  Surgeon: Leonie Green, MD;  Location: [facility] ORS;  Service: General;  Laterality: Right;    FAMILY HISTORY Family History  Problem Relation Age of Onset  . Hypertension Father   . Cancer Maternal Uncle   . Cancer Paternal Uncle   . Liver disease Paternal Grandfather        ADVANCED DIRECTIVES:    HEALTH MAINTENANCE: Social History  Substance Use Topics  . Smoking status: Never Smoker   . Smokeless tobacco: Not on file  . Alcohol Use: No     Allergies  Allergen Reactions  . Codeine Nausea Only  . Topamax [Topiramate] Other (See Comments)    Lightheaded, feels "out of body"    Current Outpatient Prescriptions  Medication Sig Dispense Refill  . alendronate (FOSAMAX) 70 MG tablet TAKE 1 TABLET BY MOUTH EVERY WEEK    . calcium-vitamin D 250-100 MG-UNIT tablet Take 1 tablet by mouth 2 (two) times daily. Reported on 02/25/2015    . Cholecalciferol (D 1000) 1000 units capsule Take 1,000 Units by mouth daily.    . Glucosamine Sulfate 500 MG TABS Take by mouth. Reported on 02/25/2015    . lidocaine-prilocaine (EMLA) cream Apply 1 application topically as needed. Apply to port then cover with saran wrap, 1-2 hours before chemotherapy appointment 30 g  2  . Misc Natural Products (OSTEO BI-FLEX ADV DOUBLE ST) TABS Take 1 tablet by mouth daily.    . propranolol (INDERAL) 10 MG tablet Take 10 mg by mouth 2 (two) times daily.    Marland Kitchen venlafaxine XR (EFFEXOR-XR) 150 MG 24 hr capsule TAKE ONE CAPSULE BY MOUTH EVERY DAY-PM     No current facility-administered medications for this visit.   Facility-Administered Medications Ordered in Other Visits  Medication Dose Route Frequency Provider Last Rate Last Dose  . sodium chloride flush (NS) 0.9 % injection 10 mL  10 mL Intravenous PRN Lloyd Huger, MD   10 mL at  05/18/15 0950    OBJECTIVE: Filed Vitals:   06/01/15 0958  BP: 122/79  Pulse: 80  Temp: 98.9 F (37.2 C)  Resp: 14     Body mass index is 25.61 kg/(m^2).    ECOG FS:0 - Asymptomatic  General: Well-developed, well-nourished, no acute distress. Eyes: Pink conjunctiva, anicteric sclera. Lungs: Clear to auscultation bilaterally. Heart: Regular rate and rhythm. No rubs, murmurs, or gallops. Abdomen: Soft, nontender, nondistended. No organomegaly noted, normoactive bowel sounds. Musculoskeletal: No edema, cyanosis, or clubbing. Neuro: Alert, answering all questions appropriately. Cranial nerves grossly intact. Skin: Itchy dry skin rash, no erythema Psych: Normal affect.  LAB RESULTS:  Lab Results  Component Value Date   NA 136 06/01/2015   K 4.3 06/01/2015   CL 105 06/01/2015   CO2 25 06/01/2015   GLUCOSE 92 06/01/2015   BUN 13 06/01/2015   CREATININE 0.63 06/01/2015   CALCIUM 9.1 06/01/2015   PROT 6.4* 06/01/2015   ALBUMIN 3.8 06/01/2015   AST 26 06/01/2015   ALT 24 06/01/2015   ALKPHOS 50 06/01/2015   BILITOT 0.3 06/01/2015   GFRNONAA >60 06/01/2015   GFRAA >60 06/01/2015    Lab Results  Component Value Date   WBC 4.5 06/01/2015   NEUTROABS 2.9 06/01/2015   HGB 9.3* 06/01/2015   HCT 26.2* 06/01/2015   MCV 96.0 06/01/2015   PLT 297 06/01/2015     STUDIES: No results found.  ASSESSMENT: Clinical stage IIa ER/PR+, invasive lobular carcinoma of the left breast.  PLAN:    1. Breast cancer: Patient tolerated cycle 1 well. Proceed with cycle 2 of Taxol today. Ultimately, patient will require aromatase inhibitor for a minimum of 5 years. MUGA was denied by insurance, for cardiac echo revealed an EF between 55 and 60%. We will see her every other week while on taxol.  Return to clinic in 1 week for labs and cycle 3 of Taxol and then in 2 weeks for labs, MD and cycle 4 Taxol.   2. Anemia: HGB 9.3 today. Secondary to chemotherapy. Monitor. 3. Constipation: Controlled  with Miralax PRN 4. Neuropathy: Mild. Monitor 5. Hypocalcemia: Calcium level WNL today at 9.1.Marland Kitchen Continue OTC calcium BID   Patient expressed understanding and was in agreement with this plan. She also understands that She can call clinic at any time with any questions, concerns, or complaints.   Breast cancer [facility])   Staging form: Breast, AJCC 7th Edition     Clinical stage from 02/12/2015: Stage IIA (T2, N0, M0) - Signed by Lloyd Huger, MD on 02/12/2015   Mayra Reel, NP   06/01/2015 10:24 AM  Patient was seen and evaluated independently and I agree with the assessment and plan as dictated above.  Lloyd Huger, MD 06/03/2015 3:57 PM

## 2015-06-08 ENCOUNTER — Other Ambulatory Visit: Payer: Managed Care, Other (non HMO)

## 2015-06-08 ENCOUNTER — Ambulatory Visit: Payer: Managed Care, Other (non HMO) | Admitting: Oncology

## 2015-06-08 ENCOUNTER — Inpatient Hospital Stay: Payer: Managed Care, Other (non HMO)

## 2015-06-08 ENCOUNTER — Ambulatory Visit: Payer: Managed Care, Other (non HMO)

## 2015-06-08 VITALS — BP 112/76 | HR 70 | Temp 98.0°F | Resp 18

## 2015-06-08 DIAGNOSIS — C50912 Malignant neoplasm of unspecified site of left female breast: Secondary | ICD-10-CM | POA: Diagnosis not present

## 2015-06-08 LAB — CBC WITH DIFFERENTIAL/PLATELET
Basophils Absolute: 0.1 10*3/uL (ref 0–0.1)
Basophils Relative: 3 %
EOS ABS: 0.2 10*3/uL (ref 0–0.7)
EOS PCT: 5 %
HCT: 27.7 % — ABNORMAL LOW (ref 35.0–47.0)
Hemoglobin: 9.8 g/dL — ABNORMAL LOW (ref 12.0–16.0)
LYMPHS ABS: 0.5 10*3/uL — AB (ref 1.0–3.6)
LYMPHS PCT: 14 %
MCH: 34.1 pg — AB (ref 26.0–34.0)
MCHC: 35.4 g/dL (ref 32.0–36.0)
MCV: 96.4 fL (ref 80.0–100.0)
MONO ABS: 0.7 10*3/uL (ref 0.2–0.9)
Monocytes Relative: 19 %
Neutro Abs: 2.3 10*3/uL (ref 1.4–6.5)
Neutrophils Relative %: 59 %
PLATELETS: 268 10*3/uL (ref 150–440)
RBC: 2.87 MIL/uL — ABNORMAL LOW (ref 3.80–5.20)
RDW: 18.8 % — AB (ref 11.5–14.5)
WBC: 3.8 10*3/uL (ref 3.6–11.0)

## 2015-06-08 LAB — COMPREHENSIVE METABOLIC PANEL
ALT: 24 U/L (ref 14–54)
ANION GAP: 4 — AB (ref 5–15)
AST: 27 U/L (ref 15–41)
Albumin: 3.9 g/dL (ref 3.5–5.0)
Alkaline Phosphatase: 54 U/L (ref 38–126)
BUN: 13 mg/dL (ref 6–20)
CALCIUM: 9 mg/dL (ref 8.9–10.3)
CO2: 26 mmol/L (ref 22–32)
Chloride: 107 mmol/L (ref 101–111)
Creatinine, Ser: 0.66 mg/dL (ref 0.44–1.00)
GLUCOSE: 93 mg/dL (ref 65–99)
POTASSIUM: 4.2 mmol/L (ref 3.5–5.1)
SODIUM: 137 mmol/L (ref 135–145)
TOTAL PROTEIN: 6.4 g/dL — AB (ref 6.5–8.1)
Total Bilirubin: 0.3 mg/dL (ref 0.3–1.2)

## 2015-06-08 MED ORDER — DEXTROSE 5 % IV SOLN
80.0000 mg/m2 | Freq: Once | INTRAVENOUS | Status: AC
  Administered 2015-06-08: 138 mg via INTRAVENOUS
  Filled 2015-06-08: qty 23

## 2015-06-08 MED ORDER — DIPHENHYDRAMINE HCL 50 MG/ML IJ SOLN
25.0000 mg | Freq: Once | INTRAMUSCULAR | Status: AC
  Administered 2015-06-08: 25 mg via INTRAVENOUS
  Filled 2015-06-08: qty 1

## 2015-06-08 MED ORDER — FAMOTIDINE IN NACL 20-0.9 MG/50ML-% IV SOLN
20.0000 mg | Freq: Once | INTRAVENOUS | Status: AC
  Administered 2015-06-08: 20 mg via INTRAVENOUS
  Filled 2015-06-08: qty 50

## 2015-06-08 MED ORDER — DEXAMETHASONE SODIUM PHOSPHATE 100 MG/10ML IJ SOLN
10.0000 mg | Freq: Once | INTRAMUSCULAR | Status: AC
  Administered 2015-06-08: 10 mg via INTRAVENOUS
  Filled 2015-06-08: qty 1

## 2015-06-08 MED ORDER — SODIUM CHLORIDE 0.9 % IV SOLN
Freq: Once | INTRAVENOUS | Status: AC
  Administered 2015-06-08: 11:00:00 via INTRAVENOUS
  Filled 2015-06-08: qty 1000

## 2015-06-08 MED ORDER — HEPARIN SOD (PORK) LOCK FLUSH 100 UNIT/ML IV SOLN
500.0000 [IU] | Freq: Once | INTRAVENOUS | Status: AC | PRN
  Administered 2015-06-08: 500 [IU]
  Filled 2015-06-08: qty 5

## 2015-06-15 ENCOUNTER — Inpatient Hospital Stay (HOSPITAL_BASED_OUTPATIENT_CLINIC_OR_DEPARTMENT_OTHER): Payer: Managed Care, Other (non HMO) | Admitting: Oncology

## 2015-06-15 ENCOUNTER — Inpatient Hospital Stay: Payer: Managed Care, Other (non HMO)

## 2015-06-15 VITALS — BP 119/74 | HR 82 | Temp 97.7°F | Resp 16 | Wt 149.9 lb

## 2015-06-15 DIAGNOSIS — K5900 Constipation, unspecified: Secondary | ICD-10-CM

## 2015-06-15 DIAGNOSIS — C50912 Malignant neoplasm of unspecified site of left female breast: Secondary | ICD-10-CM | POA: Diagnosis not present

## 2015-06-15 DIAGNOSIS — Z170 Estrogen receptor positive status [ER+]: Secondary | ICD-10-CM | POA: Diagnosis not present

## 2015-06-15 DIAGNOSIS — D6481 Anemia due to antineoplastic chemotherapy: Secondary | ICD-10-CM

## 2015-06-15 DIAGNOSIS — F419 Anxiety disorder, unspecified: Secondary | ICD-10-CM

## 2015-06-15 DIAGNOSIS — Z809 Family history of malignant neoplasm, unspecified: Secondary | ICD-10-CM

## 2015-06-15 DIAGNOSIS — Z8669 Personal history of other diseases of the nervous system and sense organs: Secondary | ICD-10-CM

## 2015-06-15 DIAGNOSIS — R200 Anesthesia of skin: Secondary | ICD-10-CM

## 2015-06-15 DIAGNOSIS — K219 Gastro-esophageal reflux disease without esophagitis: Secondary | ICD-10-CM

## 2015-06-15 DIAGNOSIS — M818 Other osteoporosis without current pathological fracture: Secondary | ICD-10-CM

## 2015-06-15 DIAGNOSIS — M129 Arthropathy, unspecified: Secondary | ICD-10-CM

## 2015-06-15 DIAGNOSIS — R251 Tremor, unspecified: Secondary | ICD-10-CM

## 2015-06-15 DIAGNOSIS — T451X5S Adverse effect of antineoplastic and immunosuppressive drugs, sequela: Secondary | ICD-10-CM

## 2015-06-15 DIAGNOSIS — G629 Polyneuropathy, unspecified: Secondary | ICD-10-CM

## 2015-06-15 DIAGNOSIS — Z79899 Other long term (current) drug therapy: Secondary | ICD-10-CM

## 2015-06-15 LAB — CBC WITH DIFFERENTIAL/PLATELET
BASOS ABS: 0.1 10*3/uL (ref 0–0.1)
Basophils Relative: 2 %
EOS ABS: 0.1 10*3/uL (ref 0–0.7)
EOS PCT: 3 %
HCT: 29.4 % — ABNORMAL LOW (ref 35.0–47.0)
Hemoglobin: 10.4 g/dL — ABNORMAL LOW (ref 12.0–16.0)
LYMPHS PCT: 14 %
Lymphs Abs: 0.6 10*3/uL — ABNORMAL LOW (ref 1.0–3.6)
MCH: 34.3 pg — AB (ref 26.0–34.0)
MCHC: 35.3 g/dL (ref 32.0–36.0)
MCV: 97.1 fL (ref 80.0–100.0)
MONO ABS: 0.6 10*3/uL (ref 0.2–0.9)
MONOS PCT: 15 %
NEUTROS ABS: 2.7 10*3/uL (ref 1.4–6.5)
NEUTROS PCT: 66 %
PLATELETS: 277 10*3/uL (ref 150–440)
RBC: 3.02 MIL/uL — AB (ref 3.80–5.20)
RDW: 18 % — AB (ref 11.5–14.5)
WBC: 4.2 10*3/uL (ref 3.6–11.0)

## 2015-06-15 LAB — COMPREHENSIVE METABOLIC PANEL
ALT: 22 U/L (ref 14–54)
AST: 25 U/L (ref 15–41)
Albumin: 3.9 g/dL (ref 3.5–5.0)
Alkaline Phosphatase: 51 U/L (ref 38–126)
Anion gap: 5 (ref 5–15)
BUN: 12 mg/dL (ref 6–20)
CHLORIDE: 104 mmol/L (ref 101–111)
CO2: 26 mmol/L (ref 22–32)
CREATININE: 0.67 mg/dL (ref 0.44–1.00)
Calcium: 9.2 mg/dL (ref 8.9–10.3)
GFR calc non Af Amer: >60 mL/min (ref >60)
Glucose, Bld: 75 mg/dL (ref 65–99)
POTASSIUM: 4.3 mmol/L (ref 3.5–5.1)
SODIUM: 135 mmol/L (ref 135–145)
Total Bilirubin: 0.4 mg/dL (ref 0.3–1.2)
Total Protein: 6.7 g/dL (ref 6.5–8.1)

## 2015-06-15 MED ORDER — DIPHENHYDRAMINE HCL 50 MG/ML IJ SOLN
25.0000 mg | Freq: Once | INTRAMUSCULAR | Status: AC
  Administered 2015-06-15: 25 mg via INTRAVENOUS
  Filled 2015-06-15: qty 1

## 2015-06-15 MED ORDER — FAMOTIDINE IN NACL 20-0.9 MG/50ML-% IV SOLN
20.0000 mg | Freq: Once | INTRAVENOUS | Status: AC
  Administered 2015-06-15: 20 mg via INTRAVENOUS
  Filled 2015-06-15: qty 50

## 2015-06-15 MED ORDER — HEPARIN SOD (PORK) LOCK FLUSH 100 UNIT/ML IV SOLN
500.0000 [IU] | Freq: Once | INTRAVENOUS | Status: AC | PRN
  Administered 2015-06-15: 500 [IU]
  Filled 2015-06-15: qty 5

## 2015-06-15 MED ORDER — SODIUM CHLORIDE 0.9 % IV SOLN
10.0000 mg | Freq: Once | INTRAVENOUS | Status: AC
  Administered 2015-06-15: 10 mg via INTRAVENOUS
  Filled 2015-06-15: qty 1

## 2015-06-15 MED ORDER — SODIUM CHLORIDE 0.9 % IV SOLN
Freq: Once | INTRAVENOUS | Status: AC
  Administered 2015-06-15: 11:00:00 via INTRAVENOUS
  Filled 2015-06-15: qty 1000

## 2015-06-15 MED ORDER — DEXTROSE 5 % IV SOLN
80.0000 mg/m2 | Freq: Once | INTRAVENOUS | Status: AC
  Administered 2015-06-15: 138 mg via INTRAVENOUS
  Filled 2015-06-15: qty 23

## 2015-06-15 NOTE — Progress Notes (Signed)
Patient does not offer any problems today.  

## 2015-06-20 NOTE — Progress Notes (Signed)
[place]  Telephone:(336) [phone] [phone]) [phone]  ID: Marisa Evans OB: 1955/06/17  MR#: [id number]  [id number]  Patient Care Team: Adin Hector, MD as PCP - General (Internal Medicine)  CHIEF COMPLAINT:  Chief Complaint  Patient presents with  . Breast Cancer    INTERVAL HISTORY: Patient returns to clinic today for further evaluation and consideration of cycle 5 of weekly taxol.  She is tolerating her treatments well without significant side effects. She has a good appetite and denies weight loss. She still has some numbness in her fingers but it comes and goes and is not painful. She denies any recent fevers.  She has no chest pain or shortness of breath. She denies any nausea, vomiting or diarrhea but does have occasional constipation and she has miralax if she needs it. She has no urinary complaints. Patient offers no further specific complaints today.  REVIEW OF SYSTEMS:   Review of Systems  Constitutional: Negative for fever, weight loss and malaise/fatigue.  HENT: Negative.   Respiratory: Negative.  Negative for shortness of breath.   Cardiovascular: Negative.  Negative for chest pain.  Gastrointestinal: Positive for constipation. Negative for nausea, vomiting and abdominal pain.  Musculoskeletal: Negative.   Skin: Negative for itching.  Neurological: Positive for sensory change. Negative for weakness.  Psychiatric/Behavioral: The patient is not nervous/anxious.     As per HPI. Otherwise, a complete review of systems is negatve.  PAST MEDICAL HISTORY: Past Medical History  Diagnosis Date  . Anxiety   . Migraine   . H/O tubal ligation   . Osteoporosis   . Arthritis   . H/O breast biopsy   . GERD (gastroesophageal reflux disease)     NO MEDS  . Cancer ([place])   . Family history of adverse reaction to anesthesia     PTS UNCLE   . Tremor     TAKES PROPRANOLOL FOR TREMORS    PAST SURGICAL HISTORY: Past Surgical History    Procedure Laterality Date  . Wrist fracture surgery Right 2014  . Breast biopsy Bilateral 1996    neg  . Breast biopsy Left 2017  . Portacath placement Right 03/02/2015    Procedure: INSERTION PORT-A-CATH;  Surgeon: Leonie Green, MD;  Location: [facility] ORS;  Service: General;  Laterality: Right;    FAMILY HISTORY Family History  Problem Relation Age of Onset  . Hypertension Father   . Cancer Maternal Uncle   . Cancer Paternal Uncle   . Liver disease Paternal Grandfather        ADVANCED DIRECTIVES:    HEALTH MAINTENANCE: Social History  Substance Use Topics  . Smoking status: Never Smoker   . Smokeless tobacco: Not on file  . Alcohol Use: No     Allergies  Allergen Reactions  . Codeine Nausea Only  . Topamax [Topiramate] Other (See Comments)    Lightheaded, feels "out of body"    Current Outpatient Prescriptions  Medication Sig Dispense Refill  . alendronate (FOSAMAX) 70 MG tablet TAKE 1 TABLET BY MOUTH EVERY WEEK    . calcium-vitamin D 250-100 MG-UNIT tablet Take 1 tablet by mouth 2 (two) times daily. Reported on 02/25/2015    . Cholecalciferol (D 1000) 1000 units capsule Take 1,000 Units by mouth daily.    . Glucosamine Sulfate 500 MG TABS Take by mouth. Reported on 02/25/2015    . lidocaine-prilocaine (EMLA) cream Apply 1 application topically as needed. Apply to port then cover with saran wrap, 1-2 hours before chemotherapy  appointment 30 g 2  . Misc Natural Products (OSTEO BI-FLEX ADV DOUBLE ST) TABS Take 1 tablet by mouth daily.    . propranolol (INDERAL) 10 MG tablet Take 10 mg by mouth 2 (two) times daily.    Marland Kitchen venlafaxine XR (EFFEXOR-XR) 150 MG 24 hr capsule TAKE ONE CAPSULE BY MOUTH EVERY DAY-PM     No current facility-administered medications for this visit.    OBJECTIVE: Filed Vitals:   06/15/15 1011  BP: 119/74  Pulse: 82  Temp: 97.7 F (36.5 C)  Resp: 16     Body mass index is 25.72 kg/(m^2).    ECOG FS:0 - Asymptomatic  General:  Well-developed, well-nourished, no acute distress. Eyes: Pink conjunctiva, anicteric sclera. Lungs: Clear to auscultation bilaterally. Heart: Regular rate and rhythm. No rubs, murmurs, or gallops. Abdomen: Soft, nontender, nondistended. No organomegaly noted, normoactive bowel sounds. Musculoskeletal: No edema, cyanosis, or clubbing. Neuro: Alert, answering all questions appropriately. Cranial nerves grossly intact. Skin: Itchy dry skin rash, no erythema Psych: Normal affect.  LAB RESULTS:  Lab Results  Component Value Date   NA 135 06/15/2015   K 4.3 06/15/2015   CL 104 06/15/2015   CO2 26 06/15/2015   GLUCOSE 75 06/15/2015   BUN 12 06/15/2015   CREATININE 0.67 06/15/2015   CALCIUM 9.2 06/15/2015   PROT 6.7 06/15/2015   ALBUMIN 3.9 06/15/2015   AST 25 06/15/2015   ALT 22 06/15/2015   ALKPHOS 51 06/15/2015   BILITOT 0.4 06/15/2015   GFRNONAA >60 06/15/2015   GFRAA >60 06/15/2015    Lab Results  Component Value Date   WBC 4.2 06/15/2015   NEUTROABS 2.7 06/15/2015   HGB 10.4* 06/15/2015   HCT 29.4* 06/15/2015   MCV 97.1 06/15/2015   PLT 277 06/15/2015     STUDIES: No results found.  ASSESSMENT: Clinical stage IIa ER/PR+, invasive lobular carcinoma of the left breast.  PLAN:    1. Breast cancer: Proceed with cycle 5 of Taxol today. Ultimately, patient will require aromatase inhibitor for a minimum of 5 years. MUGA was denied by insurance, for cardiac echo revealed an EF between 55 and 60%. We will see her every other week while on taxol.  Return to clinic in 1 week for laboratory work and consideration of cycle 6 and then in 2 weeks for further evaluation and consideration of cycle 7 of 12. 2. Anemia: Improved. Secondary to chemotherapy. Monitor. 3. Constipation: Controlled with Miralax PRN 4. Neuropathy: Mild. Monitor 5. Hypocalcemia: Continue OTC calcium BID   Patient expressed understanding and was in agreement with this plan. She also understands that She can  call clinic at any time with any questions, concerns, or complaints.   Breast cancer [facility])   Staging form: Breast, AJCC 7th Edition     Clinical stage from 02/12/2015: Stage IIA (T2, N0, M0) - Signed by Lloyd Huger, MD on 02/12/2015   Lloyd Huger, MD   06/20/2015 8:42 AM

## 2015-06-22 ENCOUNTER — Inpatient Hospital Stay: Payer: Managed Care, Other (non HMO)

## 2015-06-22 VITALS — BP 121/82 | HR 90 | Temp 97.8°F | Resp 18

## 2015-06-22 DIAGNOSIS — C50912 Malignant neoplasm of unspecified site of left female breast: Secondary | ICD-10-CM

## 2015-06-22 LAB — CBC WITH DIFFERENTIAL/PLATELET
BASOS PCT: 2 %
Basophils Absolute: 0.1 10*3/uL (ref 0–0.1)
EOS ABS: 0.1 10*3/uL (ref 0–0.7)
Eosinophils Relative: 3 %
HEMATOCRIT: 30 % — AB (ref 35.0–47.0)
HEMOGLOBIN: 10.5 g/dL — AB (ref 12.0–16.0)
LYMPHS ABS: 0.6 10*3/uL — AB (ref 1.0–3.6)
Lymphocytes Relative: 17 %
MCH: 34.8 pg — ABNORMAL HIGH (ref 26.0–34.0)
MCHC: 35.1 g/dL (ref 32.0–36.0)
MCV: 99.2 fL (ref 80.0–100.0)
MONOS PCT: 14 %
Monocytes Absolute: 0.5 10*3/uL (ref 0.2–0.9)
NEUTROS PCT: 64 %
Neutro Abs: 2.3 10*3/uL (ref 1.4–6.5)
Platelets: 240 10*3/uL (ref 150–440)
RBC: 3.02 MIL/uL — AB (ref 3.80–5.20)
RDW: 16.5 % — ABNORMAL HIGH (ref 11.5–14.5)
WBC: 3.6 10*3/uL (ref 3.6–11.0)

## 2015-06-22 LAB — COMPREHENSIVE METABOLIC PANEL
ALBUMIN: 3.8 g/dL (ref 3.5–5.0)
ALK PHOS: 50 U/L (ref 38–126)
ALT: 21 U/L (ref 14–54)
AST: 26 U/L (ref 15–41)
Anion gap: 5 (ref 5–15)
BILIRUBIN TOTAL: 0.4 mg/dL (ref 0.3–1.2)
BUN: 15 mg/dL (ref 6–20)
CALCIUM: 9 mg/dL (ref 8.9–10.3)
CO2: 26 mmol/L (ref 22–32)
CREATININE: 0.73 mg/dL (ref 0.44–1.00)
Chloride: 104 mmol/L (ref 101–111)
GFR calc Af Amer: >60 mL/min (ref >60)
GFR calc non Af Amer: >60 mL/min (ref >60)
GLUCOSE: 79 mg/dL (ref 65–99)
Potassium: 4.2 mmol/L (ref 3.5–5.1)
SODIUM: 135 mmol/L (ref 135–145)
Total Protein: 6.4 g/dL — ABNORMAL LOW (ref 6.5–8.1)

## 2015-06-22 MED ORDER — DIPHENHYDRAMINE HCL 50 MG/ML IJ SOLN
25.0000 mg | Freq: Once | INTRAMUSCULAR | Status: AC
  Administered 2015-06-22: 25 mg via INTRAVENOUS
  Filled 2015-06-22: qty 1

## 2015-06-22 MED ORDER — FAMOTIDINE IN NACL 20-0.9 MG/50ML-% IV SOLN
20.0000 mg | Freq: Once | INTRAVENOUS | Status: AC
  Administered 2015-06-22: 20 mg via INTRAVENOUS
  Filled 2015-06-22: qty 50

## 2015-06-22 MED ORDER — HEPARIN SOD (PORK) LOCK FLUSH 100 UNIT/ML IV SOLN
500.0000 [IU] | Freq: Once | INTRAVENOUS | Status: AC | PRN
  Administered 2015-06-22: 500 [IU]
  Filled 2015-06-22: qty 5

## 2015-06-22 MED ORDER — PACLITAXEL CHEMO INJECTION 300 MG/50ML
80.0000 mg/m2 | Freq: Once | INTRAVENOUS | Status: AC
  Administered 2015-06-22: 138 mg via INTRAVENOUS
  Filled 2015-06-22: qty 23

## 2015-06-22 MED ORDER — SODIUM CHLORIDE 0.9 % IV SOLN
Freq: Once | INTRAVENOUS | Status: AC
  Administered 2015-06-22: 10:00:00 via INTRAVENOUS
  Filled 2015-06-22: qty 1000

## 2015-06-22 MED ORDER — SODIUM CHLORIDE 0.9% FLUSH
10.0000 mL | INTRAVENOUS | Status: DC | PRN
  Filled 2015-06-22: qty 10

## 2015-06-22 MED ORDER — SODIUM CHLORIDE 0.9 % IV SOLN
10.0000 mg | Freq: Once | INTRAVENOUS | Status: AC
  Administered 2015-06-22: 10 mg via INTRAVENOUS
  Filled 2015-06-22: qty 1

## 2015-06-29 ENCOUNTER — Other Ambulatory Visit: Payer: Managed Care, Other (non HMO)

## 2015-06-29 ENCOUNTER — Inpatient Hospital Stay (HOSPITAL_BASED_OUTPATIENT_CLINIC_OR_DEPARTMENT_OTHER): Payer: Managed Care, Other (non HMO) | Admitting: Oncology

## 2015-06-29 ENCOUNTER — Inpatient Hospital Stay: Payer: Managed Care, Other (non HMO)

## 2015-06-29 ENCOUNTER — Ambulatory Visit: Payer: Managed Care, Other (non HMO)

## 2015-06-29 ENCOUNTER — Ambulatory Visit: Payer: Managed Care, Other (non HMO) | Admitting: Family Medicine

## 2015-06-29 VITALS — BP 115/77 | HR 81 | Temp 98.4°F

## 2015-06-29 DIAGNOSIS — Z170 Estrogen receptor positive status [ER+]: Secondary | ICD-10-CM

## 2015-06-29 DIAGNOSIS — C50912 Malignant neoplasm of unspecified site of left female breast: Secondary | ICD-10-CM | POA: Diagnosis not present

## 2015-06-29 DIAGNOSIS — C50812 Malignant neoplasm of overlapping sites of left female breast: Secondary | ICD-10-CM

## 2015-06-29 DIAGNOSIS — K5900 Constipation, unspecified: Secondary | ICD-10-CM

## 2015-06-29 DIAGNOSIS — Z8669 Personal history of other diseases of the nervous system and sense organs: Secondary | ICD-10-CM

## 2015-06-29 DIAGNOSIS — F419 Anxiety disorder, unspecified: Secondary | ICD-10-CM

## 2015-06-29 DIAGNOSIS — K219 Gastro-esophageal reflux disease without esophagitis: Secondary | ICD-10-CM

## 2015-06-29 DIAGNOSIS — M818 Other osteoporosis without current pathological fracture: Secondary | ICD-10-CM

## 2015-06-29 DIAGNOSIS — D6481 Anemia due to antineoplastic chemotherapy: Secondary | ICD-10-CM

## 2015-06-29 DIAGNOSIS — M129 Arthropathy, unspecified: Secondary | ICD-10-CM

## 2015-06-29 DIAGNOSIS — G629 Polyneuropathy, unspecified: Secondary | ICD-10-CM

## 2015-06-29 DIAGNOSIS — R251 Tremor, unspecified: Secondary | ICD-10-CM

## 2015-06-29 DIAGNOSIS — T451X5S Adverse effect of antineoplastic and immunosuppressive drugs, sequela: Secondary | ICD-10-CM

## 2015-06-29 DIAGNOSIS — Z79899 Other long term (current) drug therapy: Secondary | ICD-10-CM

## 2015-06-29 DIAGNOSIS — Z809 Family history of malignant neoplasm, unspecified: Secondary | ICD-10-CM

## 2015-06-29 DIAGNOSIS — R200 Anesthesia of skin: Secondary | ICD-10-CM

## 2015-06-29 LAB — CBC WITH DIFFERENTIAL/PLATELET
BASOS ABS: 0.1 10*3/uL (ref 0–0.1)
Basophils Relative: 3 %
Eosinophils Absolute: 0.1 10*3/uL (ref 0–0.7)
Eosinophils Relative: 3 %
HEMATOCRIT: 30 % — AB (ref 35.0–47.0)
HEMOGLOBIN: 10.5 g/dL — AB (ref 12.0–16.0)
LYMPHS PCT: 20 %
Lymphs Abs: 0.6 10*3/uL — ABNORMAL LOW (ref 1.0–3.6)
MCH: 34.9 pg — ABNORMAL HIGH (ref 26.0–34.0)
MCHC: 35 g/dL (ref 32.0–36.0)
MCV: 99.6 fL (ref 80.0–100.0)
MONO ABS: 0.4 10*3/uL (ref 0.2–0.9)
MONOS PCT: 14 %
NEUTROS ABS: 1.8 10*3/uL (ref 1.4–6.5)
NEUTROS PCT: 60 %
Platelets: 261 10*3/uL (ref 150–440)
RBC: 3.01 MIL/uL — ABNORMAL LOW (ref 3.80–5.20)
RDW: 15.7 % — AB (ref 11.5–14.5)
WBC: 3 10*3/uL — ABNORMAL LOW (ref 3.6–11.0)

## 2015-06-29 LAB — COMPREHENSIVE METABOLIC PANEL
ALBUMIN: 3.9 g/dL (ref 3.5–5.0)
ALK PHOS: 53 U/L (ref 38–126)
ALT: 19 U/L (ref 14–54)
AST: 24 U/L (ref 15–41)
Anion gap: 4 — ABNORMAL LOW (ref 5–15)
BILIRUBIN TOTAL: 0.3 mg/dL (ref 0.3–1.2)
BUN: 14 mg/dL (ref 6–20)
CALCIUM: 8.9 mg/dL (ref 8.9–10.3)
CO2: 26 mmol/L (ref 22–32)
CREATININE: 0.66 mg/dL (ref 0.44–1.00)
Chloride: 105 mmol/L (ref 101–111)
GFR calc Af Amer: >60 mL/min (ref >60)
GLUCOSE: 88 mg/dL (ref 65–99)
POTASSIUM: 4.2 mmol/L (ref 3.5–5.1)
Sodium: 135 mmol/L (ref 135–145)
TOTAL PROTEIN: 6.4 g/dL — AB (ref 6.5–8.1)

## 2015-06-29 MED ORDER — SODIUM CHLORIDE 0.9% FLUSH
10.0000 mL | INTRAVENOUS | Status: DC | PRN
  Administered 2015-06-29: 10 mL
  Filled 2015-06-29: qty 10

## 2015-06-29 MED ORDER — DEXAMETHASONE SODIUM PHOSPHATE 100 MG/10ML IJ SOLN
10.0000 mg | Freq: Once | INTRAMUSCULAR | Status: AC
  Administered 2015-06-29: 10 mg via INTRAVENOUS
  Filled 2015-06-29: qty 1

## 2015-06-29 MED ORDER — PACLITAXEL CHEMO INJECTION 300 MG/50ML
80.0000 mg/m2 | Freq: Once | INTRAVENOUS | Status: AC
  Administered 2015-06-29: 138 mg via INTRAVENOUS
  Filled 2015-06-29: qty 23

## 2015-06-29 MED ORDER — FAMOTIDINE IN NACL 20-0.9 MG/50ML-% IV SOLN
20.0000 mg | Freq: Once | INTRAVENOUS | Status: AC
  Administered 2015-06-29: 20 mg via INTRAVENOUS
  Filled 2015-06-29: qty 50

## 2015-06-29 MED ORDER — HEPARIN SOD (PORK) LOCK FLUSH 100 UNIT/ML IV SOLN
500.0000 [IU] | Freq: Once | INTRAVENOUS | Status: AC | PRN
  Administered 2015-06-29: 500 [IU]
  Filled 2015-06-29: qty 5

## 2015-06-29 MED ORDER — DIPHENHYDRAMINE HCL 50 MG/ML IJ SOLN
25.0000 mg | Freq: Once | INTRAMUSCULAR | Status: AC
  Administered 2015-06-29: 25 mg via INTRAVENOUS
  Filled 2015-06-29: qty 1

## 2015-06-29 MED ORDER — SODIUM CHLORIDE 0.9 % IV SOLN
Freq: Once | INTRAVENOUS | Status: AC
  Administered 2015-06-29: 11:00:00 via INTRAVENOUS
  Filled 2015-06-29: qty 1000

## 2015-06-29 NOTE — Progress Notes (Signed)
Patient does not offer any problems today.  

## 2015-06-30 NOTE — Progress Notes (Signed)
[place]  Telephone:(336) [phone] Fax:(336) [phone]  ID: Marisa Evans OB: 11-19-1955  MR#: [id number]  [id number]  Patient Care Team: Adin Hector, MD as PCP - General (Internal Medicine)  CHIEF COMPLAINT:  Chief Complaint  Patient presents with  . Breast Cancer    INTERVAL HISTORY: Patient returns to clinic today for further evaluation and consideration of cycle 7 of weekly taxol.  She is tolerating her treatments well without significant side effects. She has a good appetite and denies weight loss. She Does not complain of peripheral neuropathy. She denies any recent fevers.  She has no chest pain or shortness of breath. She denies any nausea, vomiting or diarrhea but does have occasional constipation and she has miralax if she needs it. She has no urinary complaints. Patient offers no further specific complaints today.  REVIEW OF SYSTEMS:   Review of Systems  Constitutional: Negative for fever, weight loss and malaise/fatigue.  HENT: Negative.   Respiratory: Negative.  Negative for shortness of breath.   Cardiovascular: Negative.  Negative for chest pain.  Gastrointestinal: Positive for constipation. Negative for nausea, vomiting and abdominal pain.  Musculoskeletal: Negative.   Skin: Negative for itching.  Neurological: Negative for sensory change and weakness.  Psychiatric/Behavioral: The patient is not nervous/anxious.     As per HPI. Otherwise, a complete review of systems is negatve.  PAST MEDICAL HISTORY: Past Medical History  Diagnosis Date  . Anxiety   . Migraine   . H/O tubal ligation   . Osteoporosis   . Arthritis   . H/O breast biopsy   . GERD (gastroesophageal reflux disease)     NO MEDS  . Cancer ([place])   . Family history of adverse reaction to anesthesia     PTS UNCLE   . Tremor     TAKES PROPRANOLOL FOR TREMORS    PAST SURGICAL HISTORY: Past Surgical History  Procedure Laterality Date  . Wrist fracture surgery  Right 2014  . Breast biopsy Bilateral 1996    neg  . Breast biopsy Left 2017  . Portacath placement Right 03/02/2015    Procedure: INSERTION PORT-A-CATH;  Surgeon: Leonie Green, MD;  Location: [facility] ORS;  Service: General;  Laterality: Right;    FAMILY HISTORY Family History  Problem Relation Age of Onset  . Hypertension Father   . Cancer Maternal Uncle   . Cancer Paternal Uncle   . Liver disease Paternal Grandfather        ADVANCED DIRECTIVES:    HEALTH MAINTENANCE: Social History  Substance Use Topics  . Smoking status: Never Smoker   . Smokeless tobacco: Not on file  . Alcohol Use: No     Allergies  Allergen Reactions  . Codeine Nausea Only  . Topamax [Topiramate] Other (See Comments)    Lightheaded, feels "out of body"    Current Outpatient Prescriptions  Medication Sig Dispense Refill  . alendronate (FOSAMAX) 70 MG tablet TAKE 1 TABLET BY MOUTH EVERY WEEK    . calcium-vitamin D 250-100 MG-UNIT tablet Take 1 tablet by mouth 2 (two) times daily. Reported on 02/25/2015    . Cholecalciferol (D 1000) 1000 units capsule Take 1,000 Units by mouth daily.    . Glucosamine Sulfate 500 MG TABS Take by mouth. Reported on 02/25/2015    . lidocaine-prilocaine (EMLA) cream Apply 1 application topically as needed. Apply to port then cover with saran wrap, 1-2 hours before chemotherapy appointment 30 g 2  . Misc Natural Products (OSTEO BI-FLEX ADV  DOUBLE ST) TABS Take 1 tablet by mouth daily.    . propranolol (INDERAL) 10 MG tablet Take 10 mg by mouth 2 (two) times daily.    Marland Kitchen venlafaxine XR (EFFEXOR-XR) 150 MG 24 hr capsule TAKE ONE CAPSULE BY MOUTH EVERY DAY-PM     No current facility-administered medications for this visit.    OBJECTIVE: Filed Vitals:   06/29/15 0929  BP: 115/80  Pulse: 85  Temp: 98.3 F (36.8 C)  Resp: 16     Body mass index is 25.98 kg/(m^2).    ECOG FS:0 - Asymptomatic  General: Well-developed, well-nourished, no acute distress. Eyes: Pink  conjunctiva, anicteric sclera. Lungs: Clear to auscultation bilaterally. Heart: Regular rate and rhythm. No rubs, murmurs, or gallops. Abdomen: Soft, nontender, nondistended. No organomegaly noted, normoactive bowel sounds. Musculoskeletal: No edema, cyanosis, or clubbing. Neuro: Alert, answering all questions appropriately. Cranial nerves grossly intact. Skin: Itchy dry skin rash, no erythema Psych: Normal affect.  LAB RESULTS:  Lab Results  Component Value Date   NA 135 06/29/2015   K 4.2 06/29/2015   CL 105 06/29/2015   CO2 26 06/29/2015   GLUCOSE 88 06/29/2015   BUN 14 06/29/2015   CREATININE 0.66 06/29/2015   CALCIUM 8.9 06/29/2015   PROT 6.4* 06/29/2015   ALBUMIN 3.9 06/29/2015   AST 24 06/29/2015   ALT 19 06/29/2015   ALKPHOS 53 06/29/2015   BILITOT 0.3 06/29/2015   GFRNONAA >60 06/29/2015   GFRAA >60 06/29/2015    Lab Results  Component Value Date   WBC 3.0* 06/29/2015   NEUTROABS 1.8 06/29/2015   HGB 10.5* 06/29/2015   HCT 30.0* 06/29/2015   MCV 99.6 06/29/2015   PLT 261 06/29/2015     STUDIES: No results found.  ASSESSMENT: Clinical stage IIa ER/PR+, invasive lobular carcinoma of the left breast. Receiving neoadjuvant chemotherapy.  PLAN:    1. Breast cancer: Proceed with cycle 7 of Taxol today. Ultimately, patient will require aromatase inhibitor for a minimum of 5 years. MUGA was denied by insurance, for cardiac echo revealed an EF between 55 and 60%.  Return to clinic in 1 and 2 weeks for laboratory work and consideration Taxol. She will then return to clinic in 3 weeks for further evaluation and consideration of cycle 10. At the conclusion of her Taxol, patient will be referred back to surgery for further evaluation. Patient may or may not require XRT depending on whether she proceed with lumpectomy or total mastectomy. 2. Anemia: Improved. Secondary to chemotherapy. Monitor. 3. Constipation: Controlled with Miralax PRN   Patient expressed  understanding and was in agreement with this plan. She also understands that She can call clinic at any time with any questions, concerns, or complaints.   Breast cancer [facility])   Staging form: Breast, AJCC 7th Edition     Clinical stage from 02/12/2015: Stage IIA (T2, N0, M0) - Signed by Lloyd Huger, MD on 02/12/2015   Lloyd Huger, MD   06/30/2015 10:37 AM

## 2015-07-06 ENCOUNTER — Inpatient Hospital Stay: Payer: Managed Care, Other (non HMO) | Attending: Oncology

## 2015-07-06 ENCOUNTER — Inpatient Hospital Stay: Payer: Managed Care, Other (non HMO)

## 2015-07-06 VITALS — BP 117/77 | HR 80 | Resp 20

## 2015-07-06 DIAGNOSIS — R251 Tremor, unspecified: Secondary | ICD-10-CM | POA: Diagnosis not present

## 2015-07-06 DIAGNOSIS — F419 Anxiety disorder, unspecified: Secondary | ICD-10-CM | POA: Diagnosis not present

## 2015-07-06 DIAGNOSIS — C50912 Malignant neoplasm of unspecified site of left female breast: Secondary | ICD-10-CM

## 2015-07-06 DIAGNOSIS — Z809 Family history of malignant neoplasm, unspecified: Secondary | ICD-10-CM | POA: Diagnosis not present

## 2015-07-06 DIAGNOSIS — M818 Other osteoporosis without current pathological fracture: Secondary | ICD-10-CM | POA: Diagnosis not present

## 2015-07-06 DIAGNOSIS — Z79899 Other long term (current) drug therapy: Secondary | ICD-10-CM | POA: Insufficient documentation

## 2015-07-06 DIAGNOSIS — K219 Gastro-esophageal reflux disease without esophagitis: Secondary | ICD-10-CM | POA: Insufficient documentation

## 2015-07-06 DIAGNOSIS — M129 Arthropathy, unspecified: Secondary | ICD-10-CM | POA: Diagnosis not present

## 2015-07-06 DIAGNOSIS — Z170 Estrogen receptor positive status [ER+]: Secondary | ICD-10-CM | POA: Insufficient documentation

## 2015-07-06 DIAGNOSIS — Z5111 Encounter for antineoplastic chemotherapy: Secondary | ICD-10-CM | POA: Insufficient documentation

## 2015-07-06 LAB — COMPREHENSIVE METABOLIC PANEL
ALBUMIN: 4 g/dL (ref 3.5–5.0)
ALT: 19 U/L (ref 14–54)
ANION GAP: 3 — AB (ref 5–15)
AST: 24 U/L (ref 15–41)
Alkaline Phosphatase: 50 U/L (ref 38–126)
BILIRUBIN TOTAL: 0.5 mg/dL (ref 0.3–1.2)
BUN: 12 mg/dL (ref 6–20)
CHLORIDE: 105 mmol/L (ref 101–111)
CO2: 27 mmol/L (ref 22–32)
Calcium: 9.1 mg/dL (ref 8.9–10.3)
Creatinine, Ser: 0.64 mg/dL (ref 0.44–1.00)
GFR calc Af Amer: >60 mL/min (ref >60)
GFR calc non Af Amer: >60 mL/min (ref >60)
GLUCOSE: 88 mg/dL (ref 65–99)
POTASSIUM: 4.4 mmol/L (ref 3.5–5.1)
Sodium: 135 mmol/L (ref 135–145)
TOTAL PROTEIN: 6.6 g/dL (ref 6.5–8.1)

## 2015-07-06 LAB — CBC WITH DIFFERENTIAL/PLATELET
BASOS PCT: 2 %
Basophils Absolute: 0.1 10*3/uL (ref 0–0.1)
Eosinophils Absolute: 0.1 10*3/uL (ref 0–0.7)
Eosinophils Relative: 2 %
HEMATOCRIT: 31.9 % — AB (ref 35.0–47.0)
HEMOGLOBIN: 11.2 g/dL — AB (ref 12.0–16.0)
LYMPHS PCT: 18 %
Lymphs Abs: 0.6 10*3/uL — ABNORMAL LOW (ref 1.0–3.6)
MCH: 35.2 pg — ABNORMAL HIGH (ref 26.0–34.0)
MCHC: 35 g/dL (ref 32.0–36.0)
MCV: 100.6 fL — ABNORMAL HIGH (ref 80.0–100.0)
MONO ABS: 0.5 10*3/uL (ref 0.2–0.9)
MONOS PCT: 14 %
NEUTROS ABS: 2.2 10*3/uL (ref 1.4–6.5)
NEUTROS PCT: 64 %
PLATELETS: 307 10*3/uL (ref 150–440)
RBC: 3.17 MIL/uL — ABNORMAL LOW (ref 3.80–5.20)
RDW: 14.4 % (ref 11.5–14.5)
WBC: 3.5 10*3/uL — ABNORMAL LOW (ref 3.6–11.0)

## 2015-07-06 MED ORDER — FAMOTIDINE IN NACL 20-0.9 MG/50ML-% IV SOLN
20.0000 mg | Freq: Once | INTRAVENOUS | Status: AC
  Administered 2015-07-06: 20 mg via INTRAVENOUS
  Filled 2015-07-06: qty 50

## 2015-07-06 MED ORDER — PACLITAXEL CHEMO INJECTION 300 MG/50ML
80.0000 mg/m2 | Freq: Once | INTRAVENOUS | Status: AC
  Administered 2015-07-06: 138 mg via INTRAVENOUS
  Filled 2015-07-06: qty 23

## 2015-07-06 MED ORDER — SODIUM CHLORIDE 0.9 % IV SOLN
10.0000 mg | Freq: Once | INTRAVENOUS | Status: AC
  Administered 2015-07-06: 10 mg via INTRAVENOUS
  Filled 2015-07-06: qty 1

## 2015-07-06 MED ORDER — HEPARIN SOD (PORK) LOCK FLUSH 100 UNIT/ML IV SOLN
500.0000 [IU] | Freq: Once | INTRAVENOUS | Status: AC | PRN
  Administered 2015-07-06: 500 [IU]
  Filled 2015-07-06: qty 5

## 2015-07-06 MED ORDER — SODIUM CHLORIDE 0.9 % IV SOLN
Freq: Once | INTRAVENOUS | Status: AC
  Administered 2015-07-06: 10:00:00 via INTRAVENOUS
  Filled 2015-07-06: qty 1000

## 2015-07-06 MED ORDER — DIPHENHYDRAMINE HCL 50 MG/ML IJ SOLN
25.0000 mg | Freq: Once | INTRAMUSCULAR | Status: AC
  Administered 2015-07-06: 25 mg via INTRAVENOUS
  Filled 2015-07-06: qty 1

## 2015-07-13 ENCOUNTER — Inpatient Hospital Stay: Payer: Managed Care, Other (non HMO)

## 2015-07-13 VITALS — BP 102/70 | HR 78 | Temp 96.7°F | Resp 20

## 2015-07-13 DIAGNOSIS — C50912 Malignant neoplasm of unspecified site of left female breast: Secondary | ICD-10-CM | POA: Diagnosis not present

## 2015-07-13 LAB — COMPREHENSIVE METABOLIC PANEL
ALK PHOS: 49 U/L (ref 38–126)
ALT: 21 U/L (ref 14–54)
AST: 26 U/L (ref 15–41)
Albumin: 3.8 g/dL (ref 3.5–5.0)
Anion gap: 4 — ABNORMAL LOW (ref 5–15)
BILIRUBIN TOTAL: 0.7 mg/dL (ref 0.3–1.2)
BUN: 13 mg/dL (ref 6–20)
CALCIUM: 8.9 mg/dL (ref 8.9–10.3)
CO2: 26 mmol/L (ref 22–32)
CREATININE: 0.63 mg/dL (ref 0.44–1.00)
Chloride: 104 mmol/L (ref 101–111)
GFR calc non Af Amer: >60 mL/min (ref >60)
Glucose, Bld: 88 mg/dL (ref 65–99)
Potassium: 4.4 mmol/L (ref 3.5–5.1)
Sodium: 134 mmol/L — ABNORMAL LOW (ref 135–145)
TOTAL PROTEIN: 6.5 g/dL (ref 6.5–8.1)

## 2015-07-13 LAB — CBC WITH DIFFERENTIAL/PLATELET
BASOS ABS: 0.1 10*3/uL (ref 0–0.1)
Basophils Relative: 4 %
EOS PCT: 2 %
Eosinophils Absolute: 0 10*3/uL (ref 0–0.7)
HEMATOCRIT: 32 % — AB (ref 35.0–47.0)
Hemoglobin: 11.2 g/dL — ABNORMAL LOW (ref 12.0–16.0)
LYMPHS ABS: 0.6 10*3/uL — AB (ref 1.0–3.6)
Lymphocytes Relative: 18 %
MCH: 35.1 pg — AB (ref 26.0–34.0)
MCHC: 34.9 g/dL (ref 32.0–36.0)
MCV: 100.5 fL — AB (ref 80.0–100.0)
Monocytes Absolute: 0.5 10*3/uL (ref 0.2–0.9)
Monocytes Relative: 14 %
Neutro Abs: 2.1 10*3/uL (ref 1.4–6.5)
Neutrophils Relative %: 62 %
PLATELETS: 282 10*3/uL (ref 150–440)
RBC: 3.18 MIL/uL — AB (ref 3.80–5.20)
RDW: 14 % (ref 11.5–14.5)
WBC: 3.4 10*3/uL — AB (ref 3.6–11.0)

## 2015-07-13 MED ORDER — FAMOTIDINE IN NACL 20-0.9 MG/50ML-% IV SOLN
20.0000 mg | Freq: Once | INTRAVENOUS | Status: AC
  Administered 2015-07-13: 20 mg via INTRAVENOUS
  Filled 2015-07-13: qty 50

## 2015-07-13 MED ORDER — DEXAMETHASONE SODIUM PHOSPHATE 100 MG/10ML IJ SOLN
10.0000 mg | Freq: Once | INTRAMUSCULAR | Status: AC
  Administered 2015-07-13: 10 mg via INTRAVENOUS
  Filled 2015-07-13: qty 1

## 2015-07-13 MED ORDER — HEPARIN SOD (PORK) LOCK FLUSH 100 UNIT/ML IV SOLN
500.0000 [IU] | Freq: Once | INTRAVENOUS | Status: AC
  Administered 2015-07-13: 500 [IU] via INTRAVENOUS
  Filled 2015-07-13: qty 5

## 2015-07-13 MED ORDER — SODIUM CHLORIDE 0.9 % IV SOLN
Freq: Once | INTRAVENOUS | Status: AC
  Administered 2015-07-13: 10:00:00 via INTRAVENOUS
  Filled 2015-07-13: qty 1000

## 2015-07-13 MED ORDER — SODIUM CHLORIDE 0.9% FLUSH
10.0000 mL | INTRAVENOUS | Status: DC | PRN
  Administered 2015-07-13: 10 mL via INTRAVENOUS
  Filled 2015-07-13: qty 10

## 2015-07-13 MED ORDER — DIPHENHYDRAMINE HCL 50 MG/ML IJ SOLN
25.0000 mg | Freq: Once | INTRAMUSCULAR | Status: AC
  Administered 2015-07-13: 25 mg via INTRAVENOUS
  Filled 2015-07-13: qty 1

## 2015-07-13 MED ORDER — DEXTROSE 5 % IV SOLN
80.0000 mg/m2 | Freq: Once | INTRAVENOUS | Status: AC
  Administered 2015-07-13: 138 mg via INTRAVENOUS
  Filled 2015-07-13: qty 23

## 2015-07-20 ENCOUNTER — Inpatient Hospital Stay (HOSPITAL_BASED_OUTPATIENT_CLINIC_OR_DEPARTMENT_OTHER): Payer: Managed Care, Other (non HMO) | Admitting: Oncology

## 2015-07-20 ENCOUNTER — Inpatient Hospital Stay: Payer: Managed Care, Other (non HMO)

## 2015-07-20 VITALS — BP 111/77 | HR 86 | Temp 98.0°F | Wt 151.5 lb

## 2015-07-20 DIAGNOSIS — F419 Anxiety disorder, unspecified: Secondary | ICD-10-CM | POA: Diagnosis not present

## 2015-07-20 DIAGNOSIS — M818 Other osteoporosis without current pathological fracture: Secondary | ICD-10-CM

## 2015-07-20 DIAGNOSIS — Z170 Estrogen receptor positive status [ER+]: Secondary | ICD-10-CM | POA: Diagnosis not present

## 2015-07-20 DIAGNOSIS — R251 Tremor, unspecified: Secondary | ICD-10-CM

## 2015-07-20 DIAGNOSIS — C50912 Malignant neoplasm of unspecified site of left female breast: Secondary | ICD-10-CM | POA: Diagnosis not present

## 2015-07-20 DIAGNOSIS — Z79899 Other long term (current) drug therapy: Secondary | ICD-10-CM

## 2015-07-20 DIAGNOSIS — M129 Arthropathy, unspecified: Secondary | ICD-10-CM

## 2015-07-20 DIAGNOSIS — Z809 Family history of malignant neoplasm, unspecified: Secondary | ICD-10-CM

## 2015-07-20 DIAGNOSIS — C50812 Malignant neoplasm of overlapping sites of left female breast: Secondary | ICD-10-CM

## 2015-07-20 DIAGNOSIS — K219 Gastro-esophageal reflux disease without esophagitis: Secondary | ICD-10-CM

## 2015-07-20 LAB — COMPREHENSIVE METABOLIC PANEL
ALT: 18 U/L (ref 14–54)
AST: 23 U/L (ref 15–41)
Albumin: 3.7 g/dL (ref 3.5–5.0)
Alkaline Phosphatase: 53 U/L (ref 38–126)
Anion gap: 4 — ABNORMAL LOW (ref 5–15)
BILIRUBIN TOTAL: 0.3 mg/dL (ref 0.3–1.2)
BUN: 11 mg/dL (ref 6–20)
CALCIUM: 8.6 mg/dL — AB (ref 8.9–10.3)
CO2: 25 mmol/L (ref 22–32)
CREATININE: 0.63 mg/dL (ref 0.44–1.00)
Chloride: 104 mmol/L (ref 101–111)
Glucose, Bld: 97 mg/dL (ref 65–99)
Potassium: 4.3 mmol/L (ref 3.5–5.1)
Sodium: 133 mmol/L — ABNORMAL LOW (ref 135–145)
TOTAL PROTEIN: 6.5 g/dL (ref 6.5–8.1)

## 2015-07-20 LAB — CBC WITH DIFFERENTIAL/PLATELET
BASOS ABS: 0.1 10*3/uL (ref 0–0.1)
Basophils Relative: 2 %
Eosinophils Absolute: 0.1 10*3/uL (ref 0–0.7)
Eosinophils Relative: 2 %
HEMATOCRIT: 32.5 % — AB (ref 35.0–47.0)
Hemoglobin: 11.3 g/dL — ABNORMAL LOW (ref 12.0–16.0)
LYMPHS ABS: 0.6 10*3/uL — AB (ref 1.0–3.6)
LYMPHS PCT: 18 %
MCH: 35 pg — AB (ref 26.0–34.0)
MCHC: 34.9 g/dL (ref 32.0–36.0)
MCV: 100.2 fL — AB (ref 80.0–100.0)
MONO ABS: 0.5 10*3/uL (ref 0.2–0.9)
Monocytes Relative: 13 %
NEUTROS ABS: 2.4 10*3/uL (ref 1.4–6.5)
Neutrophils Relative %: 65 %
Platelets: 274 10*3/uL (ref 150–440)
RBC: 3.24 MIL/uL — AB (ref 3.80–5.20)
RDW: 13.4 % (ref 11.5–14.5)
WBC: 3.6 10*3/uL (ref 3.6–11.0)

## 2015-07-20 MED ORDER — FAMOTIDINE IN NACL 20-0.9 MG/50ML-% IV SOLN
20.0000 mg | Freq: Once | INTRAVENOUS | Status: AC
  Administered 2015-07-20: 20 mg via INTRAVENOUS
  Filled 2015-07-20: qty 50

## 2015-07-20 MED ORDER — SODIUM CHLORIDE 0.9% FLUSH
10.0000 mL | INTRAVENOUS | Status: DC | PRN
  Administered 2015-07-20: 10 mL
  Filled 2015-07-20: qty 10

## 2015-07-20 MED ORDER — DEXTROSE 5 % IV SOLN
80.0000 mg/m2 | Freq: Once | INTRAVENOUS | Status: AC
  Administered 2015-07-20: 138 mg via INTRAVENOUS
  Filled 2015-07-20: qty 23

## 2015-07-20 MED ORDER — SODIUM CHLORIDE 0.9 % IV SOLN
Freq: Once | INTRAVENOUS | Status: AC
  Administered 2015-07-20: 10:00:00 via INTRAVENOUS
  Filled 2015-07-20: qty 1000

## 2015-07-20 MED ORDER — DIPHENHYDRAMINE HCL 50 MG/ML IJ SOLN
25.0000 mg | Freq: Once | INTRAMUSCULAR | Status: AC
  Administered 2015-07-20: 25 mg via INTRAVENOUS
  Filled 2015-07-20: qty 1

## 2015-07-20 MED ORDER — HEPARIN SOD (PORK) LOCK FLUSH 100 UNIT/ML IV SOLN
500.0000 [IU] | Freq: Once | INTRAVENOUS | Status: AC | PRN
  Administered 2015-07-20: 500 [IU]
  Filled 2015-07-20: qty 5

## 2015-07-20 MED ORDER — SODIUM CHLORIDE 0.9 % IV SOLN
10.0000 mg | Freq: Once | INTRAVENOUS | Status: AC
  Administered 2015-07-20: 10 mg via INTRAVENOUS
  Filled 2015-07-20: qty 1

## 2015-07-20 NOTE — Progress Notes (Signed)
[place]  Telephone:(336) [phone] Fax:(336) [phone]  ID: MARGE LICHLITER OB: 04-10-1955  MR#: [id number]  [id number]  Patient Care Team: Adin Hector, MD as PCP - General (Internal Medicine)  CHIEF COMPLAINT:  Chief Complaint  Patient presents with  . Malignant neoplasm of left female breast, unspecified site o    INTERVAL HISTORY: Patient returns to clinic today for further evaluation and consideration of cycle 10 of weekly taxol.  She is tolerating her treatments well without significant side effects. She has a good appetite and denies weight loss. She does not complain of peripheral neuropathy. She denies any recent fevers.  She has no chest pain or shortness of breath. She denies any nausea, vomiting or diarrhea but does have occasional constipation and she has miralax if she needs it. She has no urinary complaints. Patient offers no further specific complaints today.  REVIEW OF SYSTEMS:   Review of Systems  Constitutional: Negative for fever, weight loss and malaise/fatigue.  HENT: Negative.   Respiratory: Negative.  Negative for shortness of breath.   Cardiovascular: Negative.  Negative for chest pain.  Gastrointestinal: Negative for nausea, vomiting, abdominal pain and constipation.  Musculoskeletal: Negative.   Skin: Negative for itching.  Neurological: Negative for sensory change and weakness.  Psychiatric/Behavioral: The patient is not nervous/anxious.     As per HPI. Otherwise, a complete review of systems is negatve.  PAST MEDICAL HISTORY: Past Medical History  Diagnosis Date  . Anxiety   . Migraine   . H/O tubal ligation   . Osteoporosis   . Arthritis   . H/O breast biopsy   . GERD (gastroesophageal reflux disease)     NO MEDS  . Cancer ([place])   . Family history of adverse reaction to anesthesia     PTS UNCLE   . Tremor     TAKES PROPRANOLOL FOR TREMORS    PAST SURGICAL HISTORY: Past Surgical History  Procedure  Laterality Date  . Wrist fracture surgery Right 2014  . Breast biopsy Bilateral 1996    neg  . Breast biopsy Left 2017  . Portacath placement Right 03/02/2015    Procedure: INSERTION PORT-A-CATH;  Surgeon: Leonie Green, MD;  Location: [facility] ORS;  Service: General;  Laterality: Right;    FAMILY HISTORY Family History  Problem Relation Age of Onset  . Hypertension Father   . Cancer Maternal Uncle   . Cancer Paternal Uncle   . Liver disease Paternal Grandfather        ADVANCED DIRECTIVES:    HEALTH MAINTENANCE: Social History  Substance Use Topics  . Smoking status: Never Smoker   . Smokeless tobacco: Not on file  . Alcohol Use: No     Allergies  Allergen Reactions  . Codeine Nausea Only  . Topamax [Topiramate] Other (See Comments)    Lightheaded, feels "out of body"    Current Outpatient Prescriptions  Medication Sig Dispense Refill  . alendronate (FOSAMAX) 70 MG tablet TAKE 1 TABLET BY MOUTH EVERY WEEK    . calcium-vitamin D 250-100 MG-UNIT tablet Take 1 tablet by mouth 2 (two) times daily. Reported on 02/25/2015    . Cholecalciferol (D 1000) 1000 units capsule Take 1,000 Units by mouth daily.    . Glucosamine Sulfate 500 MG TABS Take by mouth. Reported on 02/25/2015    . lidocaine-prilocaine (EMLA) cream Apply 1 application topically as needed. Apply to port then cover with saran wrap, 1-2 hours before chemotherapy appointment 30 g 2  . Misc  Natural Products (OSTEO BI-FLEX ADV DOUBLE ST) TABS Take 1 tablet by mouth daily.    . propranolol (INDERAL) 10 MG tablet Take 10 mg by mouth 2 (two) times daily.    Marland Kitchen venlafaxine XR (EFFEXOR-XR) 150 MG 24 hr capsule TAKE ONE CAPSULE BY MOUTH EVERY DAY-PM     No current facility-administered medications for this visit.    OBJECTIVE: Filed Vitals:   07/20/15 0918  BP: 111/77  Pulse: 86  Temp: 98 F (36.7 C)     Body mass index is 25.98 kg/(m^2).    ECOG FS:0 - Asymptomatic  General: Well-developed, well-nourished, no  acute distress. Eyes: Pink conjunctiva, anicteric sclera. Lungs: Clear to auscultation bilaterally. Heart: Regular rate and rhythm. No rubs, murmurs, or gallops. Abdomen: Soft, nontender, nondistended. No organomegaly noted, normoactive bowel sounds. Musculoskeletal: No edema, cyanosis, or clubbing. Neuro: Alert, answering all questions appropriately. Cranial nerves grossly intact. Skin: Itchy dry skin rash, no erythema Psych: Normal affect.  LAB RESULTS:  Lab Results  Component Value Date   NA 133* 07/20/2015   K 4.3 07/20/2015   CL 104 07/20/2015   CO2 25 07/20/2015   GLUCOSE 97 07/20/2015   BUN 11 07/20/2015   CREATININE 0.63 07/20/2015   CALCIUM 8.6* 07/20/2015   PROT 6.5 07/20/2015   ALBUMIN 3.7 07/20/2015   AST 23 07/20/2015   ALT 18 07/20/2015   ALKPHOS 53 07/20/2015   BILITOT 0.3 07/20/2015   GFRNONAA >60 07/20/2015   GFRAA >60 07/20/2015    Lab Results  Component Value Date   WBC 3.6 07/20/2015   NEUTROABS 2.4 07/20/2015   HGB 11.3* 07/20/2015   HCT 32.5* 07/20/2015   MCV 100.2* 07/20/2015   PLT 274 07/20/2015     STUDIES: No results found.  ASSESSMENT: Clinical stage IIa ER/PR+, invasive lobular carcinoma of the left breast. Receiving neoadjuvant chemotherapy.  PLAN:    1. Left breast cancer: Proceed with cycle 10 of Taxol today. Ultimately, patient will require aromatase inhibitor for a minimum of 5 years. MUGA was denied by insurance, for cardiac echo revealed an EF between 55 and 60%.  Return to clinic in 1 week for laboratory work and consideration Taxol. She will then return to clinic in 2 weeks for further evaluation and consideration of cycle 12. At the conclusion of her Taxol, patient will be referred back to surgery for further evaluation. Patient may or may not require XRT depending on whether she proceed with lumpectomy or total mastectomy. 2. Anemia: Improved. Secondary to chemotherapy. Monitor. 3. Constipation: Controlled with Miralax  PRN   Patient expressed understanding and was in agreement with this plan. She also understands that She can call clinic at any time with any questions, concerns, or complaints.   Breast cancer [facility])   Staging form: Breast, AJCC 7th Edition     Clinical stage from 02/12/2015: Stage IIA (T2, N0, M0) - Signed by Lloyd Huger, MD on 02/12/2015   Lloyd Huger, MD   07/20/2015 11:22 PM

## 2015-07-27 ENCOUNTER — Inpatient Hospital Stay: Payer: Managed Care, Other (non HMO)

## 2015-07-27 DIAGNOSIS — C50912 Malignant neoplasm of unspecified site of left female breast: Secondary | ICD-10-CM

## 2015-07-27 LAB — COMPREHENSIVE METABOLIC PANEL
ALT: 17 U/L (ref 14–54)
ANION GAP: 4 — AB (ref 5–15)
AST: 20 U/L (ref 15–41)
Albumin: 3.9 g/dL (ref 3.5–5.0)
Alkaline Phosphatase: 54 U/L (ref 38–126)
BUN: 12 mg/dL (ref 6–20)
CHLORIDE: 101 mmol/L (ref 101–111)
CO2: 27 mmol/L (ref 22–32)
Calcium: 8.9 mg/dL (ref 8.9–10.3)
Creatinine, Ser: 0.66 mg/dL (ref 0.44–1.00)
Glucose, Bld: 92 mg/dL (ref 65–99)
POTASSIUM: 4.5 mmol/L (ref 3.5–5.1)
Sodium: 132 mmol/L — ABNORMAL LOW (ref 135–145)
Total Bilirubin: 0.4 mg/dL (ref 0.3–1.2)
Total Protein: 6.4 g/dL — ABNORMAL LOW (ref 6.5–8.1)

## 2015-07-27 LAB — CBC WITH DIFFERENTIAL/PLATELET
Basophils Absolute: 0.1 10*3/uL (ref 0–0.1)
Basophils Relative: 2 %
EOS PCT: 2 %
Eosinophils Absolute: 0 10*3/uL (ref 0–0.7)
HCT: 32.1 % — ABNORMAL LOW (ref 35.0–47.0)
Hemoglobin: 11.3 g/dL — ABNORMAL LOW (ref 12.0–16.0)
LYMPHS ABS: 0.6 10*3/uL — AB (ref 1.0–3.6)
LYMPHS PCT: 20 %
MCH: 35 pg — AB (ref 26.0–34.0)
MCHC: 35.3 g/dL (ref 32.0–36.0)
MCV: 99.1 fL (ref 80.0–100.0)
MONO ABS: 0.5 10*3/uL (ref 0.2–0.9)
Monocytes Relative: 16 %
Neutro Abs: 1.9 10*3/uL (ref 1.4–6.5)
Neutrophils Relative %: 60 %
PLATELETS: 290 10*3/uL (ref 150–440)
RBC: 3.24 MIL/uL — ABNORMAL LOW (ref 3.80–5.20)
RDW: 12.8 % (ref 11.5–14.5)
WBC: 3.2 10*3/uL — ABNORMAL LOW (ref 3.6–11.0)

## 2015-07-27 MED ORDER — SODIUM CHLORIDE 0.9 % IV SOLN
Freq: Once | INTRAVENOUS | Status: AC
  Administered 2015-07-27: 10:00:00 via INTRAVENOUS
  Filled 2015-07-27: qty 1000

## 2015-07-27 MED ORDER — SODIUM CHLORIDE 0.9 % IV SOLN
10.0000 mg | Freq: Once | INTRAVENOUS | Status: AC
  Administered 2015-07-27: 10 mg via INTRAVENOUS
  Filled 2015-07-27: qty 1

## 2015-07-27 MED ORDER — PACLITAXEL CHEMO INJECTION 300 MG/50ML
80.0000 mg/m2 | Freq: Once | INTRAVENOUS | Status: AC
  Administered 2015-07-27: 138 mg via INTRAVENOUS
  Filled 2015-07-27: qty 23

## 2015-07-27 MED ORDER — HEPARIN SOD (PORK) LOCK FLUSH 100 UNIT/ML IV SOLN
500.0000 [IU] | Freq: Once | INTRAVENOUS | Status: AC | PRN
  Administered 2015-07-27: 500 [IU]
  Filled 2015-07-27: qty 5

## 2015-07-27 MED ORDER — FAMOTIDINE IN NACL 20-0.9 MG/50ML-% IV SOLN
20.0000 mg | Freq: Once | INTRAVENOUS | Status: AC
  Administered 2015-07-27: 20 mg via INTRAVENOUS
  Filled 2015-07-27: qty 50

## 2015-07-27 MED ORDER — DIPHENHYDRAMINE HCL 50 MG/ML IJ SOLN
25.0000 mg | Freq: Once | INTRAMUSCULAR | Status: AC
  Administered 2015-07-27: 25 mg via INTRAVENOUS
  Filled 2015-07-27: qty 1

## 2015-08-02 ENCOUNTER — Inpatient Hospital Stay: Payer: Managed Care, Other (non HMO) | Attending: Oncology

## 2015-08-02 ENCOUNTER — Inpatient Hospital Stay: Payer: Managed Care, Other (non HMO)

## 2015-08-02 ENCOUNTER — Inpatient Hospital Stay (HOSPITAL_BASED_OUTPATIENT_CLINIC_OR_DEPARTMENT_OTHER): Payer: Managed Care, Other (non HMO) | Admitting: Oncology

## 2015-08-02 VITALS — BP 123/83 | HR 86 | Temp 98.1°F | Resp 20 | Wt 154.1 lb

## 2015-08-02 DIAGNOSIS — M818 Other osteoporosis without current pathological fracture: Secondary | ICD-10-CM | POA: Insufficient documentation

## 2015-08-02 DIAGNOSIS — C50812 Malignant neoplasm of overlapping sites of left female breast: Secondary | ICD-10-CM | POA: Insufficient documentation

## 2015-08-02 DIAGNOSIS — K5900 Constipation, unspecified: Secondary | ICD-10-CM | POA: Diagnosis not present

## 2015-08-02 DIAGNOSIS — C50912 Malignant neoplasm of unspecified site of left female breast: Secondary | ICD-10-CM

## 2015-08-02 DIAGNOSIS — Z809 Family history of malignant neoplasm, unspecified: Secondary | ICD-10-CM | POA: Diagnosis not present

## 2015-08-02 DIAGNOSIS — Z8669 Personal history of other diseases of the nervous system and sense organs: Secondary | ICD-10-CM

## 2015-08-02 DIAGNOSIS — D6481 Anemia due to antineoplastic chemotherapy: Secondary | ICD-10-CM | POA: Insufficient documentation

## 2015-08-02 DIAGNOSIS — Z5111 Encounter for antineoplastic chemotherapy: Secondary | ICD-10-CM | POA: Insufficient documentation

## 2015-08-02 DIAGNOSIS — F419 Anxiety disorder, unspecified: Secondary | ICD-10-CM | POA: Diagnosis not present

## 2015-08-02 DIAGNOSIS — Z170 Estrogen receptor positive status [ER+]: Secondary | ICD-10-CM | POA: Diagnosis not present

## 2015-08-02 DIAGNOSIS — K219 Gastro-esophageal reflux disease without esophagitis: Secondary | ICD-10-CM | POA: Diagnosis not present

## 2015-08-02 LAB — CBC WITH DIFFERENTIAL/PLATELET
BASOS PCT: 2 %
Basophils Absolute: 0.1 10*3/uL (ref 0–0.1)
Eosinophils Absolute: 0 10*3/uL (ref 0–0.7)
Eosinophils Relative: 1 %
HEMATOCRIT: 32.8 % — AB (ref 35.0–47.0)
Hemoglobin: 11.6 g/dL — ABNORMAL LOW (ref 12.0–16.0)
Lymphocytes Relative: 17 %
Lymphs Abs: 0.7 10*3/uL — ABNORMAL LOW (ref 1.0–3.6)
MCH: 34.8 pg — AB (ref 26.0–34.0)
MCHC: 35.3 g/dL (ref 32.0–36.0)
MCV: 98.8 fL (ref 80.0–100.0)
MONO ABS: 0.4 10*3/uL (ref 0.2–0.9)
MONOS PCT: 10 %
NEUTROS ABS: 2.7 10*3/uL (ref 1.4–6.5)
NEUTROS PCT: 70 %
Platelets: 283 10*3/uL (ref 150–440)
RBC: 3.32 MIL/uL — ABNORMAL LOW (ref 3.80–5.20)
RDW: 13 % (ref 11.5–14.5)
WBC: 3.9 10*3/uL (ref 3.6–11.0)

## 2015-08-02 LAB — COMPREHENSIVE METABOLIC PANEL
ALT: 14 U/L (ref 14–54)
ANION GAP: 4 — AB (ref 5–15)
AST: 20 U/L (ref 15–41)
Albumin: 3.9 g/dL (ref 3.5–5.0)
Alkaline Phosphatase: 48 U/L (ref 38–126)
BILIRUBIN TOTAL: 0.1 mg/dL — AB (ref 0.3–1.2)
BUN: 16 mg/dL (ref 6–20)
CO2: 27 mmol/L (ref 22–32)
Calcium: 9.3 mg/dL (ref 8.9–10.3)
Chloride: 104 mmol/L (ref 101–111)
Creatinine, Ser: 0.71 mg/dL (ref 0.44–1.00)
GLUCOSE: 84 mg/dL (ref 65–99)
Potassium: 4.7 mmol/L (ref 3.5–5.1)
Sodium: 135 mmol/L (ref 135–145)
TOTAL PROTEIN: 6.5 g/dL (ref 6.5–8.1)

## 2015-08-02 MED ORDER — DIPHENHYDRAMINE HCL 50 MG/ML IJ SOLN
25.0000 mg | Freq: Once | INTRAMUSCULAR | Status: AC
  Administered 2015-08-02: 25 mg via INTRAVENOUS
  Filled 2015-08-02: qty 1

## 2015-08-02 MED ORDER — FAMOTIDINE IN NACL 20-0.9 MG/50ML-% IV SOLN
20.0000 mg | Freq: Once | INTRAVENOUS | Status: AC
  Administered 2015-08-02: 20 mg via INTRAVENOUS
  Filled 2015-08-02: qty 50

## 2015-08-02 MED ORDER — HEPARIN SOD (PORK) LOCK FLUSH 100 UNIT/ML IV SOLN
500.0000 [IU] | Freq: Once | INTRAVENOUS | Status: AC | PRN
  Administered 2015-08-02: 500 [IU]

## 2015-08-02 MED ORDER — SODIUM CHLORIDE 0.9 % IV SOLN
Freq: Once | INTRAVENOUS | Status: AC
  Administered 2015-08-02: 10:00:00 via INTRAVENOUS
  Filled 2015-08-02: qty 1000

## 2015-08-02 MED ORDER — DEXTROSE 5 % IV SOLN
80.0000 mg/m2 | Freq: Once | INTRAVENOUS | Status: AC
  Administered 2015-08-02: 138 mg via INTRAVENOUS
  Filled 2015-08-02: qty 23

## 2015-08-02 MED ORDER — SODIUM CHLORIDE 0.9 % IV SOLN
10.0000 mg | Freq: Once | INTRAVENOUS | Status: AC
  Administered 2015-08-02: 10 mg via INTRAVENOUS
  Filled 2015-08-02: qty 1

## 2015-08-02 MED ORDER — HEPARIN SOD (PORK) LOCK FLUSH 100 UNIT/ML IV SOLN
INTRAVENOUS | Status: AC
  Filled 2015-08-02: qty 5

## 2015-08-02 NOTE — Progress Notes (Addendum)
[place]  [phone] [phone]  ID: Marisa Evans OB: Jul 27, 1955  MR#: [id number]  [id number]  Patient Care Team: Adin Hector, MD as PCP - General (Internal Medicine)  CHIEF COMPLAINT: Clinical stage IIa ER/PR+, invasive lobular carcinoma of the left breast, overlapping sites  INTERVAL HISTORY: Patient returns to clinic today for further evaluation and consideration of cycle 12 of weekly taxol.  She is tolerating her treatments well without significant side effects. She has a good appetite and denies weight loss. She does not complain of peripheral neuropathy. She denies any recent fevers.  She has no chest pain or shortness of breath. She denies any nausea, vomiting or diarrhea but does have occasional constipation and she has miralax if she needs it. She has no urinary complaints. Patient offers no further specific complaints today.  REVIEW OF SYSTEMS:   Review of Systems  Constitutional: Negative for fever, weight loss and malaise/fatigue.  HENT: Negative.   Respiratory: Negative.  Negative for shortness of breath.   Cardiovascular: Negative.  Negative for chest pain.  Gastrointestinal: Positive for constipation. Negative for nausea, vomiting and abdominal pain.  Musculoskeletal: Negative.   Skin: Negative for itching.  Neurological: Negative for sensory change and weakness.  Psychiatric/Behavioral: The patient is not nervous/anxious.     As per HPI. Otherwise, a complete review of systems is negatve.  PAST MEDICAL HISTORY: Past Medical History  Diagnosis Date  . Anxiety   . Migraine   . H/O tubal ligation   . Osteoporosis   . Arthritis   . H/O breast biopsy   . GERD (gastroesophageal reflux disease)     NO MEDS  . Cancer ([place])   . Family history of adverse reaction to anesthesia     PTS UNCLE   . Tremor     TAKES PROPRANOLOL FOR TREMORS    PAST SURGICAL HISTORY: Past Surgical History  Procedure Laterality  Date  . Wrist fracture surgery Right 2014  . Breast biopsy Bilateral 1996    neg  . Breast biopsy Left 2017  . Portacath placement Right 03/02/2015    Procedure: INSERTION PORT-A-CATH;  Surgeon: Leonie Green, MD;  Location: [facility] ORS;  Service: General;  Laterality: Right;    FAMILY HISTORY Family History  Problem Relation Age of Onset  . Hypertension Father   . Cancer Maternal Uncle   . Cancer Paternal Uncle   . Liver disease Paternal Grandfather        ADVANCED DIRECTIVES:    HEALTH MAINTENANCE: Social History  Substance Use Topics  . Smoking status: Never Smoker   . Smokeless tobacco: Not on file  . Alcohol Use: No     Allergies  Allergen Reactions  . Codeine Nausea Only  . Topamax [Topiramate] Other (See Comments)    Lightheaded, feels "out of body"    Current Outpatient Prescriptions  Medication Sig Dispense Refill  . alendronate (FOSAMAX) 70 MG tablet TAKE 1 TABLET BY MOUTH EVERY WEEK    . calcium-vitamin D 250-100 MG-UNIT tablet Take 1 tablet by mouth 2 (two) times daily. Reported on 02/25/2015    . Cholecalciferol (D 1000) 1000 units capsule Take 1,000 Units by mouth daily.    . Glucosamine Sulfate 500 MG TABS Take by mouth. Reported on 02/25/2015    . lidocaine-prilocaine (EMLA) cream Apply 1 application topically as needed. Apply to port then cover with saran wrap, 1-2 hours before chemotherapy appointment 30 g 2  . Misc Natural Products (OSTEO BI-FLEX ADV  DOUBLE ST) TABS Take 1 tablet by mouth daily.    . propranolol (INDERAL) 10 MG tablet Take 10 mg by mouth 2 (two) times daily.    Marland Kitchen venlafaxine XR (EFFEXOR-XR) 150 MG 24 hr capsule TAKE ONE CAPSULE BY MOUTH EVERY DAY-PM     No current facility-administered medications for this visit.   Facility-Administered Medications Ordered in Other Visits  Medication Dose Route Frequency Provider Last Rate Last Dose  . dexamethasone (DECADRON) 10 mg in sodium chloride 0.9 % 50 mL IVPB  10 mg Intravenous Once  Lloyd Huger, MD      . famotidine (PEPCID) IVPB 20 mg premix  20 mg Intravenous Once Lloyd Huger, MD   20 mg at 08/02/15 1010  . PACLitaxel (TAXOL) 138 mg in dextrose 5 % 250 mL chemo infusion (</= 80mg /m2)  80 mg/m2 (Treatment Plan Actual) Intravenous Once Lloyd Huger, MD        OBJECTIVE: Filed Vitals:   08/02/15 0934  BP: 123/83  Pulse: 86  Temp: 98.1 F (36.7 C)  Resp: 20     Body mass index is 26.44 kg/(m^2).    ECOG FS:0 - Asymptomatic  General: Well-developed, well-nourished, no acute distress. Eyes: Pink conjunctiva, anicteric sclera. Lungs: Clear to auscultation bilaterally. Heart: Regular rate and rhythm. No rubs, murmurs, or gallops. Abdomen: Soft, nontender, nondistended. No organomegaly noted, normoactive bowel sounds. Musculoskeletal: No edema, cyanosis, or clubbing. Neuro: Alert, answering all questions appropriately. Cranial nerves grossly intact. Skin: Itchy dry skin rash, no erythema Psych: Normal affect.  LAB RESULTS:  Lab Results  Component Value Date   NA 135 08/02/2015   K 4.7 08/02/2015   CL 104 08/02/2015   CO2 27 08/02/2015   GLUCOSE 84 08/02/2015   BUN 16 08/02/2015   CREATININE 0.71 08/02/2015   CALCIUM 9.3 08/02/2015   PROT 6.5 08/02/2015   ALBUMIN 3.9 08/02/2015   AST 20 08/02/2015   ALT 14 08/02/2015   ALKPHOS 48 08/02/2015   BILITOT 0.1* 08/02/2015   GFRNONAA >60 08/02/2015   GFRAA >60 08/02/2015    Lab Results  Component Value Date   WBC 3.9 08/02/2015   NEUTROABS 2.7 08/02/2015   HGB 11.6* 08/02/2015   HCT 32.8* 08/02/2015   MCV 98.8 08/02/2015   PLT 283 08/02/2015     STUDIES: No results found.  ASSESSMENT: Clinical stage IIa ER/PR+, invasive lobular carcinoma of the left breast, overlapping sites. Receiving neoadjuvant chemotherapy.  PLAN:    1. Clinical stage IIa ER/PR+, invasive lobular carcinoma of the left breast, overlapping sites: Proceed with cycle 12 of Taxol today. Ultimately, patient  will require aromatase inhibitor for a minimum of 5 years. MUGA was denied by insurance, for cardiac echo revealed an EF between 55 and 60%.  Patient has now completed her neoadjuvant chemotherapy and a referral has been made to surgery for definitive treatment. No follow-up has been scheduled at this time, but patient will follow-up 1-2 weeks postoperatively to discuss her final pathology results. Patient may or may not require XRT depending on whether she proceed with lumpectomy or total mastectomy. 2. Anemia: Improved. Secondary to chemotherapy. Monitor. 3. Constipation: Controlled with Miralax PRN   Patient expressed understanding and was in agreement with this plan. She also understands that She can call clinic at any time with any questions, concerns, or complaints.   Breast cancer [facility])   Staging form: Breast, AJCC 7th Edition     Clinical stage from 02/12/2015: Stage IIA (T2, N0, M0) - Signed  by Lloyd Huger, MD on 02/12/2015   Lloyd Huger, MD   08/02/2015 10:21 AM

## 2015-08-04 ENCOUNTER — Telehealth: Payer: Self-pay | Admitting: *Deleted

## 2015-08-04 NOTE — Telephone Encounter (Signed)
Follow up appointment scheduled with Dr. Tamala Julian for 08/30/15 at 3:00 pm. Patient notified of appointment time.

## 2015-08-04 NOTE — Telephone Encounter (Signed)
Thanks. She will need to come back to [place] in mid-August to discuss final path results.

## 2015-08-31 ENCOUNTER — Other Ambulatory Visit: Payer: Self-pay | Admitting: Surgery

## 2015-08-31 DIAGNOSIS — C50212 Malignant neoplasm of upper-inner quadrant of left female breast: Secondary | ICD-10-CM

## 2015-08-31 DIAGNOSIS — R921 Mammographic calcification found on diagnostic imaging of breast: Secondary | ICD-10-CM

## 2015-08-31 DIAGNOSIS — C50912 Malignant neoplasm of unspecified site of left female breast: Secondary | ICD-10-CM

## 2015-08-31 DIAGNOSIS — D0502 Lobular carcinoma in situ of left breast: Secondary | ICD-10-CM

## 2015-09-03 ENCOUNTER — Encounter
Admission: RE | Admit: 2015-09-03 | Discharge: 2015-09-03 | Disposition: A | Discharge status: Home / Self Care | Payer: Managed Care, Other (non HMO) | Source: Ambulatory Visit | Attending: Surgery | Admitting: Surgery

## 2015-09-03 NOTE — Patient Instructions (Signed)
  Your procedure is scheduled on: 09-14-15  Report to [place] @ 8 AM    Remember: Instructions that are not followed completely may result in serious medical risk, up to and including death, or upon the discretion of your surgeon and anesthesiologist your surgery may need to be rescheduled.    _x___ 1. Do not eat food or drink liquids after midnight. No gum chewing or hard candies.     __x__ 2. No Alcohol for 24 hours before or after surgery.   __x__3. No Smoking for 24 prior to surgery.   ____  4. Bring all medications with you on the day of surgery if instructed.    __x__ 5. Notify your doctor if there is any change in your medical condition     (cold, fever, infections).     Do not wear jewelry, make-up, hairpins, clips or nail polish.  Do not wear lotions, powders, or perfumes. You may wear deodorant.  Do not shave 48 hours prior to surgery. Men may shave face and neck.  Do not bring valuables to the hospital.    [facility] is not responsible for any belongings or valuables.               Contacts, dentures or bridgework may not be worn into surgery.  Leave your suitcase in the car. After surgery it may be brought to your room.  For patients admitted to the hospital, discharge time is determined by your treatment team.   Patients discharged the day of surgery will not be allowed to drive home.    Please read over the following fact sheets that you were given:   [facility] Preparing for Surgery and or MRSA Information   _x___ Take these medicines the morning of surgery with A SIP OF WATER:    1. PROPRANOLOL  2.  3.  4.  5.  6.  ____ Fleet Enema (as directed)   ____ Use CHG Soap or sage wipes as directed on instruction sheet   ____ Use inhalers on the day of surgery and bring to hospital day of surgery  ____ Stop metformin 2 days prior to surgery    ____ Take 1/2 of usual insulin dose the night before surgery and none on the morning of  surgery.    ____ Stop aspirin or coumadin, or plavix  _x__ Stop Anti-inflammatories such as Advil, Aleve, Ibuprofen, Motrin, Naproxen,          Naprosyn, Goodies powders or aspirin products. Ok to take Tylenol.   _X___ Stop supplements until after surgery-STOP GLUCOSAMINE-CHONDROITIN-MSM 7 DAYS PRIOR TO SURGERY  ____ Bring C-Pap to the hospital.

## 2015-09-06 ENCOUNTER — Encounter: Payer: Self-pay | Admitting: Oncology

## 2015-09-07 ENCOUNTER — Inpatient Hospital Stay: Payer: Managed Care, Other (non HMO) | Attending: Oncology

## 2015-09-07 DIAGNOSIS — K5900 Constipation, unspecified: Secondary | ICD-10-CM | POA: Insufficient documentation

## 2015-09-07 DIAGNOSIS — Z452 Encounter for adjustment and management of vascular access device: Secondary | ICD-10-CM | POA: Diagnosis not present

## 2015-09-07 DIAGNOSIS — Z8669 Personal history of other diseases of the nervous system and sense organs: Secondary | ICD-10-CM | POA: Insufficient documentation

## 2015-09-07 DIAGNOSIS — M818 Other osteoporosis without current pathological fracture: Secondary | ICD-10-CM | POA: Diagnosis not present

## 2015-09-07 DIAGNOSIS — Z9012 Acquired absence of left breast and nipple: Secondary | ICD-10-CM | POA: Diagnosis not present

## 2015-09-07 DIAGNOSIS — D6481 Anemia due to antineoplastic chemotherapy: Secondary | ICD-10-CM | POA: Diagnosis not present

## 2015-09-07 DIAGNOSIS — K219 Gastro-esophageal reflux disease without esophagitis: Secondary | ICD-10-CM | POA: Diagnosis not present

## 2015-09-07 DIAGNOSIS — C50812 Malignant neoplasm of overlapping sites of left female breast: Secondary | ICD-10-CM | POA: Diagnosis not present

## 2015-09-07 DIAGNOSIS — Z170 Estrogen receptor positive status [ER+]: Secondary | ICD-10-CM | POA: Insufficient documentation

## 2015-09-07 DIAGNOSIS — R251 Tremor, unspecified: Secondary | ICD-10-CM | POA: Insufficient documentation

## 2015-09-07 DIAGNOSIS — Z79899 Other long term (current) drug therapy: Secondary | ICD-10-CM | POA: Diagnosis not present

## 2015-09-07 DIAGNOSIS — Z809 Family history of malignant neoplasm, unspecified: Secondary | ICD-10-CM | POA: Insufficient documentation

## 2015-09-07 DIAGNOSIS — F419 Anxiety disorder, unspecified: Secondary | ICD-10-CM | POA: Insufficient documentation

## 2015-09-07 DIAGNOSIS — M129 Arthropathy, unspecified: Secondary | ICD-10-CM | POA: Diagnosis not present

## 2015-09-07 DIAGNOSIS — Z95828 Presence of other vascular implants and grafts: Secondary | ICD-10-CM

## 2015-09-07 MED ORDER — SODIUM CHLORIDE 0.9% FLUSH
10.0000 mL | INTRAVENOUS | Status: DC | PRN
  Administered 2015-09-07: 10 mL via INTRAVENOUS
  Filled 2015-09-07: qty 10

## 2015-09-07 MED ORDER — HEPARIN SOD (PORK) LOCK FLUSH 100 UNIT/ML IV SOLN
500.0000 [IU] | Freq: Once | INTRAVENOUS | Status: AC
Start: 2015-09-07 — End: 2015-09-07
  Administered 2015-09-07: 500 [IU] via INTRAVENOUS

## 2015-09-14 ENCOUNTER — Ambulatory Visit: Payer: Managed Care, Other (non HMO) | Admitting: Anesthesiology

## 2015-09-14 ENCOUNTER — Ambulatory Visit
Admission: RE | Admit: 2015-09-14 | Discharge: 2015-09-14 | Disposition: A | Discharge status: Home / Self Care | Payer: Managed Care, Other (non HMO) | Source: Ambulatory Visit | Attending: Surgery | Admitting: Surgery

## 2015-09-14 ENCOUNTER — Ambulatory Visit
Admission: RE | Admit: 2015-09-14 | Discharge: 2015-09-14 | Disposition: A | Discharge status: Home / Self Care | Payer: Managed Care, Other (non HMO) | Source: Ambulatory Visit | Attending: Oncology | Admitting: Oncology

## 2015-09-14 ENCOUNTER — Ambulatory Visit: Payer: Managed Care, Other (non HMO)

## 2015-09-14 ENCOUNTER — Encounter: Payer: Self-pay | Admitting: *Deleted

## 2015-09-14 ENCOUNTER — Encounter
Admission: RE | Disposition: A | Discharge status: Home / Self Care | Payer: Self-pay | Source: Ambulatory Visit | Attending: Surgery

## 2015-09-14 ENCOUNTER — Encounter: Payer: Self-pay | Admitting: Anesthesiology

## 2015-09-14 DIAGNOSIS — Z8249 Family history of ischemic heart disease and other diseases of the circulatory system: Secondary | ICD-10-CM | POA: Diagnosis not present

## 2015-09-14 DIAGNOSIS — Z8261 Family history of arthritis: Secondary | ICD-10-CM | POA: Insufficient documentation

## 2015-09-14 DIAGNOSIS — G250 Essential tremor: Secondary | ICD-10-CM | POA: Insufficient documentation

## 2015-09-14 DIAGNOSIS — Z8042 Family history of malignant neoplasm of prostate: Secondary | ICD-10-CM | POA: Diagnosis not present

## 2015-09-14 DIAGNOSIS — C50212 Malignant neoplasm of upper-inner quadrant of left female breast: Secondary | ICD-10-CM | POA: Diagnosis present

## 2015-09-14 DIAGNOSIS — F419 Anxiety disorder, unspecified: Secondary | ICD-10-CM | POA: Diagnosis not present

## 2015-09-14 DIAGNOSIS — Z808 Family history of malignant neoplasm of other organs or systems: Secondary | ICD-10-CM | POA: Diagnosis not present

## 2015-09-14 DIAGNOSIS — Z800 Family history of malignant neoplasm of digestive organs: Secondary | ICD-10-CM | POA: Insufficient documentation

## 2015-09-14 DIAGNOSIS — Z885 Allergy status to narcotic agent status: Secondary | ICD-10-CM | POA: Diagnosis not present

## 2015-09-14 DIAGNOSIS — C50919 Malignant neoplasm of unspecified site of unspecified female breast: Secondary | ICD-10-CM

## 2015-09-14 DIAGNOSIS — C50912 Malignant neoplasm of unspecified site of left female breast: Secondary | ICD-10-CM

## 2015-09-14 DIAGNOSIS — K219 Gastro-esophageal reflux disease without esophagitis: Secondary | ICD-10-CM | POA: Insufficient documentation

## 2015-09-14 DIAGNOSIS — G43909 Migraine, unspecified, not intractable, without status migrainosus: Secondary | ICD-10-CM | POA: Insufficient documentation

## 2015-09-14 DIAGNOSIS — Z8489 Family history of other specified conditions: Secondary | ICD-10-CM | POA: Insufficient documentation

## 2015-09-14 DIAGNOSIS — Z79899 Other long term (current) drug therapy: Secondary | ICD-10-CM | POA: Insufficient documentation

## 2015-09-14 DIAGNOSIS — Z8262 Family history of osteoporosis: Secondary | ICD-10-CM | POA: Insufficient documentation

## 2015-09-14 DIAGNOSIS — D0502 Lobular carcinoma in situ of left breast: Secondary | ICD-10-CM | POA: Insufficient documentation

## 2015-09-14 DIAGNOSIS — Z823 Family history of stroke: Secondary | ICD-10-CM | POA: Insufficient documentation

## 2015-09-14 DIAGNOSIS — Z803 Family history of malignant neoplasm of breast: Secondary | ICD-10-CM | POA: Diagnosis not present

## 2015-09-14 DIAGNOSIS — Z888 Allergy status to other drugs, medicaments and biological substances status: Secondary | ICD-10-CM | POA: Diagnosis not present

## 2015-09-14 DIAGNOSIS — Z825 Family history of asthma and other chronic lower respiratory diseases: Secondary | ICD-10-CM | POA: Insufficient documentation

## 2015-09-14 DIAGNOSIS — D72819 Decreased white blood cell count, unspecified: Secondary | ICD-10-CM | POA: Diagnosis not present

## 2015-09-14 HISTORY — PX: PARTIAL MASTECTOMY WITH AXILLARY SENTINEL LYMPH NODE BIOPSY AND NEEDLE: SHX6006

## 2015-09-14 HISTORY — PX: BREAST LUMPECTOMY: SHX2

## 2015-09-14 HISTORY — DX: Malignant neoplasm of unspecified site of unspecified female breast: C50.919

## 2015-09-14 SURGERY — PARTIAL MASTECTOMY WITH AXILLARY SENTINEL LYMPH NODE BIOPSY AND NEEDLE LOCALIZATION AND MAMMOSITE EVAL DEVICE
Anesthesia: General | Laterality: Left | Wound class: Clean

## 2015-09-14 MED ORDER — LACTATED RINGERS IV SOLN
INTRAVENOUS | Status: DC
  Administered 2015-09-14: 50 mL/h via INTRAVENOUS
  Administered 2015-09-14: 13:00:00 via INTRAVENOUS

## 2015-09-14 MED ORDER — MIDAZOLAM HCL 2 MG/2ML IJ SOLN
INTRAMUSCULAR | Status: DC | PRN
  Administered 2015-09-14: 2 mg via INTRAVENOUS

## 2015-09-14 MED ORDER — FAMOTIDINE 20 MG PO TABS
ORAL_TABLET | ORAL | Status: AC
Start: 2015-09-14 — End: 2015-09-14
  Administered 2015-09-14: 20 mg via ORAL
  Filled 2015-09-14: qty 1

## 2015-09-14 MED ORDER — OXYCODONE HCL 5 MG/5ML PO SOLN: 5.0000 mg | Freq: Once | ORAL | Status: DC | PRN

## 2015-09-14 MED ORDER — BUPIVACAINE-EPINEPHRINE 0.5% -1:200000 IJ SOLN
INTRAMUSCULAR | Status: DC | PRN
  Administered 2015-09-14: 24 mL

## 2015-09-14 MED ORDER — ROCURONIUM BROMIDE 100 MG/10ML IV SOLN
INTRAVENOUS | Status: DC | PRN
  Administered 2015-09-14: 50 mg via INTRAVENOUS
  Administered 2015-09-14: 20 mg via INTRAVENOUS

## 2015-09-14 MED ORDER — ONDANSETRON HCL 4 MG/2ML IJ SOLN
INTRAMUSCULAR | Status: DC | PRN
Start: 2015-09-14 — End: 2015-09-14
  Administered 2015-09-14: 4 mg via INTRAVENOUS

## 2015-09-14 MED ORDER — FENTANYL CITRATE (PF) 100 MCG/2ML IJ SOLN
INTRAMUSCULAR | Status: DC | PRN
  Administered 2015-09-14: 250 ug via INTRAVENOUS

## 2015-09-14 MED ORDER — DEXTROSE 5 % IV SOLN
INTRAVENOUS | Status: DC | PRN
  Administered 2015-09-14: 30 ug/min via INTRAVENOUS

## 2015-09-14 MED ORDER — LIDOCAINE HCL (CARDIAC) 20 MG/ML IV SOLN
INTRAVENOUS | Status: DC | PRN
  Administered 2015-09-14: 100 mg via INTRAVENOUS

## 2015-09-14 MED ORDER — PHENYLEPHRINE HCL 10 MG/ML IJ SOLN
INTRAMUSCULAR | Status: DC | PRN
  Administered 2015-09-14: 100 ug via INTRAVENOUS
  Administered 2015-09-14: 300 ug via INTRAVENOUS

## 2015-09-14 MED ORDER — PROPOFOL 10 MG/ML IV BOLUS
INTRAVENOUS | Status: DC | PRN
  Administered 2015-09-14: 30 mg via INTRAVENOUS
  Administered 2015-09-14: 140 mg via INTRAVENOUS

## 2015-09-14 MED ORDER — FENTANYL CITRATE (PF) 100 MCG/2ML IJ SOLN
INTRAMUSCULAR | Status: AC
  Filled 2015-09-14: qty 2

## 2015-09-14 MED ORDER — HYDROMORPHONE HCL 1 MG/ML IJ SOLN
INTRAMUSCULAR | Status: DC | PRN
  Administered 2015-09-14: 1 mg via INTRAVENOUS

## 2015-09-14 MED ORDER — OXYCODONE HCL 5 MG PO TABS: 5.0000 mg | ORAL_TABLET | Freq: Once | ORAL | Status: DC | PRN

## 2015-09-14 MED ORDER — HYDROCODONE-ACETAMINOPHEN 5-325 MG PO TABS: 1.0000 | ORAL_TABLET | ORAL | 0 refills | Status: DC | PRN

## 2015-09-14 MED ORDER — TECHNETIUM TC 99M SULFUR COLLOID
1.0600 | Freq: Once | INTRAVENOUS | Status: AC | PRN
  Administered 2015-09-14: 1.06 via INTRAVENOUS

## 2015-09-14 MED ORDER — FAMOTIDINE 20 MG PO TABS
20.0000 mg | ORAL_TABLET | Freq: Once | ORAL | Status: AC
  Administered 2015-09-14: 20 mg via ORAL

## 2015-09-14 MED ORDER — FENTANYL CITRATE (PF) 100 MCG/2ML IJ SOLN
25.0000 ug | INTRAMUSCULAR | Status: DC | PRN
  Administered 2015-09-14 (×2): 50 ug via INTRAVENOUS

## 2015-09-14 MED ORDER — DEXAMETHASONE SODIUM PHOSPHATE 4 MG/ML IJ SOLN
INTRAMUSCULAR | Status: DC | PRN
  Administered 2015-09-14: 5 mg via INTRAVENOUS

## 2015-09-14 MED ORDER — BUPIVACAINE-EPINEPHRINE (PF) 0.5% -1:200000 IJ SOLN
INTRAMUSCULAR | Status: AC
  Filled 2015-09-14: qty 30

## 2015-09-14 SURGICAL SUPPLY — 29 items
BLADE SURG 15 STRL LF DISP TIS (BLADE) ×1 IMPLANT
BLADE SURG 15 STRL SS (BLADE) ×2
CANISTER SUCT 1200ML W/VALVE (MISCELLANEOUS) ×3 IMPLANT
CHLORAPREP W/TINT 26ML (MISCELLANEOUS) ×3 IMPLANT
CNTNR SPEC 2.5X3XGRAD LEK (MISCELLANEOUS) ×1
CONT SPEC 4OZ STER OR WHT (MISCELLANEOUS) ×2
CONTAINER SPEC 2.5X3XGRAD LEK (MISCELLANEOUS) ×1 IMPLANT
DEVICE DUBIN SPECIMEN MAMMOGRA (MISCELLANEOUS) ×6 IMPLANT
DRAPE LAPAROTOMY 77X122 PED (DRAPES) ×3 IMPLANT
ELECT REM PT RETURN 9FT ADLT (ELECTROSURGICAL) ×3
ELECTRODE REM PT RTRN 9FT ADLT (ELECTROSURGICAL) ×1 IMPLANT
GLOVE BIO SURGEON STRL SZ7.5 (GLOVE) ×15 IMPLANT
GOWN STRL REUS W/ TWL LRG LVL3 (GOWN DISPOSABLE) ×3 IMPLANT
GOWN STRL REUS W/TWL LRG LVL3 (GOWN DISPOSABLE) ×6
KIT RM TURNOVER STRD PROC AR (KITS) ×3 IMPLANT
LABEL OR SOLS (LABEL) ×3 IMPLANT
LIQUID BAND (GAUZE/BANDAGES/DRESSINGS) ×3 IMPLANT
MARGIN MAP 10MM (MISCELLANEOUS) ×6 IMPLANT
NEEDLE HYPO 25X1 1.5 SAFETY (NEEDLE) ×3 IMPLANT
PACK BASIN MINOR ARMC (MISCELLANEOUS) ×3 IMPLANT
SUT CHROMIC 3 0 SH 27 (SUTURE) ×3 IMPLANT
SUT CHROMIC 4 0 RB 1X27 (SUTURE) ×3 IMPLANT
SUT ETHILON 3-0 FS-10 30 BLK (SUTURE) ×3
SUT MNCRL 4-0 (SUTURE) ×2
SUT MNCRL 4-0 27XMFL (SUTURE) ×1
SUTURE EHLN 3-0 FS-10 30 BLK (SUTURE) ×1 IMPLANT
SUTURE MNCRL 4-0 27XMF (SUTURE) ×1 IMPLANT
SYRINGE 10CC LL (SYRINGE) ×3 IMPLANT
WATER STERILE IRR 1000ML POUR (IV SOLUTION) ×3 IMPLANT

## 2015-09-14 NOTE — H&P (Signed)
  She comes in today for left partial mastectomy in the upper inner quadrant with excision of mass of upper outer quadrant and sentinel lymph node.  I have reviewed the case with Dr. Brigitte Pulse  the radiologist. I have reviewed her mammogram images with Kopan's wires.  I discussed the plan for left partial mastectomy with excision of second mass and sentinel lymph node biopsy.  She reports no other change in overall condition.

## 2015-09-14 NOTE — Anesthesia Postprocedure Evaluation (Signed)
Anesthesia Post Note  Patient: Marisa Evans  Procedure(s) Performed: Procedure(s) (LRB): PARTIAL MASTECTOMY WITH AXILLARY SENTINEL LYMPH NODE BIOPSY AND NEEDLE LOCALIZATION AND MAMMOSITE EVAL DEVICE (Left)  Patient location during evaluation: PACU Anesthesia Type: General Level of consciousness: awake and alert and oriented Pain management: pain level controlled Vital Signs Assessment: post-procedure vital signs reviewed and stable Respiratory status: spontaneous breathing, nonlabored ventilation and respiratory function stable Cardiovascular status: blood pressure returned to baseline and stable Postop Assessment: no signs of nausea or vomiting Anesthetic complications: no    Last Vitals:  Vitals:   09/14/15 1443 09/14/15 1458  BP: 125/79 120/84  Pulse: 98 91  Resp:    Temp:      Last Pain:  Vitals:   09/14/15 1458  TempSrc:   PainSc: 4                  Alahna Dunne

## 2015-09-14 NOTE — Op Note (Signed)
OPERATIVE REPORT  PREOPERATIVE  DIAGNOSIS: . 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 Left partial mastectomy, excision left breast mass, axillary sentinel lymph node biopsy  ANESTHESIA:  General  SURGEON: Rochel Brome  MD   INDICATIONS: . She is a history of biopsy of the upper inner quadrant of the left breast demonstrating infiltrating lobular carcinoma with an area of new microcalcifications.. She also has had needle biopsy findings upper outer quadrant of lobular carcinoma in situ with an area of old microcalcifications. She had preoperative chemotherapy. Surgery was recommended for definitive treatment.  Preoperatively she did have x-ray needle localization with 2 wires placed in the upper inner quadrant to bracket the cancer and the microcalcifications. She had one Kopan's wire inserted in the left upper quadrant to mark the biopsy site. She also had preoperative injection of radioactive technetium sulfur colloid.  With the patient on the operating table in the supine position under general anesthesia the dressing was removed from the left breast. The 3 Kopan's wires were cut 2 cm from the skin. The breast and surrounding chest wall and axilla were prepared with ChloraPrep and draped in a sterile manner.  A curvilinear incision was made in the upper inner quadrant 4 cm from the nipple from the 10:00 to 1:00 position and also removed a narrow ellipse of skin which was 8 mm in width. Dissection was carried down through subcutaneous tissues to encounter the anterior wire and also dissected to encounter the posterior wire. Multiple bleeding points were cauterized and multiple bleeding points were suture ligated with 4-0 chromic. A mass  of tissue between the 2 wires including the 2 wires was excised. The posterior margin was carried down to the deep fascia. The specimen was marked with the margin maps to suture markers to the specimen to mark the cranial caudal medial and lateral margins. The specimen was submitted for specimen mammogram and pathology to check for margins. Hemostasis appeared to be intact  Attention was turned to the upper outer quadrant where a curvilinear incision was made 6 cm from the nipple from 1:00 to 3:00 position. This incision was carried down through subcutaneous tissues. Electrocautery was used for hemostasis. The Kopan's wire was encountered and a mass of tissue approximately 2 x 2 x 3 cm was removed. It was also marked with margin maps to mark the cranial caudal medial lateral and skin margins. This was submitted for specimen mammogram and for pathology.  The gamma counter was used in the axilla to demonstrate location of radioactivity. An obliquely oriented 4 cm incision was made and carried down through subcutaneous tissues and dissected down adjacent to the rib cage to encounter an area of radioactivity. There was a palpable small lymph node. A portion of tissue including fat and the lymph node was excised which was approximately 1-1/2 cm in dimension. The ex vivo count was in the range of 100 to 130 counts per second. The specimen was submitted for sentinel lymph node evaluation. The background count was minimal. Hemostasis was intact. There was no remaining palpable mass within the axilla.  The pathologist called back 2 times once to report that the margins for the mass of the upper inner quadrant were satisfactory. The second time the pathologist called back to report that the biopsy  marker was found in the specimen but there was no identifiable mass.  All 3 wounds were inspected and hemostasis was intact. All 3 wounds were infiltrated with half percent Sensorcaine with epinephrine. All 3 wounds were  closed using interrupted 3-0 chromic subcutaneous sutures and also 4-0 Monocryl subcuticular sutures and LiquiBand.  The patient tolerated the procedure satisfactorily and was then prepared for transfer to the recovery room  [facility].D.

## 2015-09-14 NOTE — Anesthesia Preprocedure Evaluation (Signed)
Anesthesia Evaluation  Patient identified by MRN, date of birth, ID band Patient awake    Reviewed: Allergy & Precautions, H&P , NPO status , Patient's Chart, lab work & pertinent test results  History of Anesthesia Complications (+) Family history of anesthesia reaction and history of anesthetic complications  Airway Mallampati: III  TM Distance: <3 FB Neck ROM: limited    Dental  (+) Poor Dentition   Pulmonary neg pulmonary ROS, neg shortness of breath,    Pulmonary exam normal breath sounds clear to auscultation       Cardiovascular Exercise Tolerance: Good (-) angina(-) Past MI and (-) DOE negative cardio ROS Normal cardiovascular exam Rhythm:regular Rate:Normal     Neuro/Psych  Headaches, PSYCHIATRIC DISORDERS Anxiety    GI/Hepatic negative GI ROS, Neg liver ROS, GERD  Controlled,  Endo/Other  negative endocrine ROS  Renal/GU negative Renal ROS  negative genitourinary   Musculoskeletal  (+) Arthritis ,   Abdominal   Peds  Hematology   Anesthesia Other Findings Past Medical History: No date: Anxiety No date: Arthritis     Comment: knees 09/14/2015: Breast cancer ([place])     Comment: left breast lumpectomy No date: Cancer ([place]) No date: Family history of adverse reaction to anesthes*     Comment: PTS UNCLE-PARALYZED AFTER SURGERY AND IT TOOK               A WHILE BEFORE HE COULD MOVE AGAIN No date: GERD (gastroesophageal reflux disease)     Comment: NO MEDS No date: H/O breast biopsy No date: H/O tubal ligation No date: Migraine     Comment: not a problem as of now...due to hormones No date: Osteoporosis No date: Tremor     Comment: TAKES PROPRANOLOL FOR TREMORS  Past Surgical History: 1996: BREAST BIOPSY Bilateral     Comment: neg 2017: BREAST BIOPSY Left     Comment: positive 03/02/2015: PORTACATH PLACEMENT Right     Comment: Procedure: INSERTION PORT-A-CATH;  Surgeon:               Leonie Green, MD;  Location: [facility] ORS;                Service: General;  Laterality: Right; No date: TUBAL LIGATION 2014: WRIST FRACTURE SURGERY Right     Comment: titanium plate in right wrist  BMI    Body Mass Index:  26.43 kg/m      Reproductive/Obstetrics negative OB ROS                             Anesthesia Physical Anesthesia Plan  ASA: III  Anesthesia Plan: General ETT   Post-op Pain Management:    Induction:   Airway Management Planned:   Additional Equipment:   Intra-op Plan:   Post-operative Plan:   Informed Consent: I have reviewed the patients History and Physical, chart, labs and discussed the procedure including the risks, benefits and alternatives for the proposed anesthesia with the patient or authorized representative who has indicated his/her understanding and acceptance.   Dental Advisory Given  Plan Discussed with: Anesthesiologist, CRNA and Surgeon  Anesthesia Plan Comments:         Anesthesia Quick Evaluation

## 2015-09-14 NOTE — Discharge Instructions (Addendum)
Take Tylenol or Norco if needed for pain.  May were bra as desired for comfort and support.  May shower.   AMBULATORY SURGERY  DISCHARGE INSTRUCTIONS   1) The drugs that you were given will stay in your system until tomorrow so for the next 24 hours you should not:  A) Drive an automobile B) Make any legal decisions C) Drink any alcoholic beverage   2) You may resume regular meals tomorrow.  Today it is better to start with liquids and gradually work up to solid foods.  You may eat anything you prefer, but it is better to start with liquids, then soup and crackers, and gradually work up to solid foods.   3) Please notify your doctor immediately if you have any unusual bleeding, trouble breathing, redness and pain at the surgery site, drainage, fever, or pain not relieved by medication.    4) Additional Instructions:        Please contact your physician with any problems or Same Day Surgery at [phone], Monday through Friday 6 am to 4 pm, or [place] at [facility] number at [phone].

## 2015-09-14 NOTE — OR Nursing (Signed)
Dr. Tamala Julian visited.  Ok'd dc home

## 2015-09-14 NOTE — Transfer of Care (Signed)
Immediate Anesthesia Transfer of Care Note  Patient: Marisa Evans  Procedure(s) Performed: Procedure(s): PARTIAL MASTECTOMY WITH AXILLARY SENTINEL LYMPH NODE BIOPSY AND NEEDLE LOCALIZATION AND MAMMOSITE EVAL DEVICE (Left)  Patient Location: PACU  Anesthesia Type:General  Level of Consciousness: awake and patient cooperative  Airway & Oxygen Therapy: Patient Spontanous Breathing and Patient connected to nasal cannula oxygen  Post-op Assessment: Report given to RN and Post -op Vital signs reviewed and stable  Post vital signs: Reviewed and stable  Last Vitals:  Vitals:   09/14/15 1011 09/14/15 1428  BP: 120/73   Pulse: 82   Resp: 16   Temp: 36.6 C (P) 36.9 C    Last Pain:  Vitals:   09/14/15 1011  TempSrc: Tympanic      Patients Stated Pain Goal: 0 ([id number])  Complications: No apparent anesthesia complications

## 2015-09-14 NOTE — Anesthesia Procedure Notes (Signed)
Procedure Name: Intubation Date/Time: 09/14/2015 11:58 AM Performed by: Rosaria Ferries, Tayden Duran Pre-anesthesia Checklist: Patient identified, Emergency Drugs available, Suction available and Patient being monitored Patient Re-evaluated:Patient Re-evaluated prior to inductionOxygen Delivery Method: Circle system utilized Preoxygenation: Pre-oxygenation with 100% oxygen Intubation Type: IV induction Laryngoscope Size: Mac and 3 Grade View: Grade I Tube size: 7.0 mm Number of attempts: 1 Placement Confirmation: ETT inserted through vocal cords under direct vision,  positive ETCO2 and breath sounds checked- equal and bilateral Secured at: 21 cm Dental Injury: Teeth and Oropharynx as per pre-operative assessment

## 2015-09-15 ENCOUNTER — Encounter: Payer: Self-pay | Admitting: Surgery

## 2015-09-16 LAB — SURGICAL PATHOLOGY

## 2015-09-26 NOTE — Progress Notes (Signed)
[place]  [phone] [phone]  ID: LOVELY KERINS OB: 1955-09-09  MR#: [id number]  VEH#:[id number]  Patient Care Team: Adin Hector, MD as PCP - General (Internal Medicine) Leonie Green, MD as Referring Physician (Surgery)  CHIEF COMPLAINT: Pathologic stage IA ER/PR+, invasive lobular carcinoma of the left breast, overlapping sites  INTERVAL HISTORY: Patient returns to clinic today for further evaluation and discussion of her pathology results. She tolerated her lumpectomy well without significant side effects. She currently feels well and is asymptomatic.   She has a good appetite and denies weight loss. She does not complain of peripheral neuropathy. She denies any recent fevers.  She has no chest pain or shortness of breath. She denies any nausea, vomiting or diarrhea but does have occasional constipation and she has miralax if she needs it. She has no urinary complaints. Patient offers no specific complaints today.  REVIEW OF SYSTEMS:   Review of Systems  Constitutional: Negative for fever, malaise/fatigue and weight loss.  HENT: Negative.   Respiratory: Negative.  Negative for shortness of breath.   Cardiovascular: Negative.  Negative for chest pain.  Gastrointestinal: Positive for constipation. Negative for abdominal pain, nausea and vomiting.  Musculoskeletal: Negative.   Skin: Negative for itching.  Neurological: Negative for sensory change and weakness.  Psychiatric/Behavioral: The patient is not nervous/anxious.     As per HPI. Otherwise, a complete review of systems is negatve.  PAST MEDICAL HISTORY: Past Medical History:  Diagnosis Date  . Anxiety   . Arthritis    knees  . Breast cancer ([place]) 09/14/2015   left breast lumpectomy  . Cancer ([place])   . Family history of adverse reaction to anesthesia    PTS UNCLE-PARALYZED AFTER SURGERY AND IT TOOK A WHILE BEFORE HE COULD MOVE AGAIN  . GERD (gastroesophageal  reflux disease)    NO MEDS  . H/O breast biopsy   . H/O tubal ligation   . Migraine    not a problem as of now...due to hormones  . Osteoporosis   . Tremor    TAKES PROPRANOLOL FOR TREMORS    PAST SURGICAL HISTORY: Past Surgical History:  Procedure Laterality Date  . BREAST BIOPSY Bilateral 1996   neg  . BREAST BIOPSY Left 2017   positive  . PARTIAL MASTECTOMY WITH AXILLARY SENTINEL LYMPH NODE BIOPSY AND NEEDLE Left 09/14/2015   Procedure: PARTIAL MASTECTOMY WITH AXILLARY SENTINEL LYMPH NODE BIOPSY AND NEEDLE LOCALIZATION AND MAMMOSITE EVAL DEVICE;  Surgeon: Leonie Green, MD;  Location: [facility] ORS;  Service: General;  Laterality: Left;  . PORTACATH PLACEMENT Right 03/02/2015   Procedure: INSERTION PORT-A-CATH;  Surgeon: Leonie Green, MD;  Location: [facility] ORS;  Service: General;  Laterality: Right;  . TUBAL LIGATION    . WRIST FRACTURE SURGERY Right 2014   titanium plate in right wrist    FAMILY HISTORY Family History  Problem Relation Age of Onset  . Hypertension Father   . Cancer Maternal Uncle   . Cancer Paternal Uncle   . Liver disease Paternal Grandfather        ADVANCED DIRECTIVES:    HEALTH MAINTENANCE: Social History  Substance Use Topics  . Smoking status: Never Smoker  . Smokeless tobacco: Never Used  . Alcohol use Yes     Comment: RARE     Allergies  Allergen Reactions  . Codeine Nausea Only  . Topamax [Topiramate] Other (See Comments)    Lightheaded, feels "out of body"    Current  Outpatient Prescriptions  Medication Sig Dispense Refill  . alendronate (FOSAMAX) 70 MG tablet TAKE 1 TABLET BY MOUTH EVERY WEEK-FRIDAY    . calcium-vitamin D 250-100 MG-UNIT tablet Take 1 tablet by mouth 2 (two) times daily. Reported on 02/25/2015    . Cholecalciferol (D 1000) 1000 units capsule Take 1,000 Units by mouth daily.    . Glucosamine-Chondroitin-MSM 375-300-250 MG TABS Take 1 tablet by mouth daily.    Marland Kitchen lidocaine-prilocaine (EMLA) cream Apply 1  application topically as needed. Apply to port then cover with saran wrap, 1-2 hours before chemotherapy appointment 30 g 2  . propranolol (INDERAL) 10 MG tablet Take 10 mg by mouth 2 (two) times daily. PT TAKES THIS FOR TREMORS    . venlafaxine XR (EFFEXOR-XR) 150 MG 24 hr capsule TAKE ONE CAPSULE BY MOUTH EVERY DAY-PM    . HYDROcodone-acetaminophen (NORCO) 5-325 MG tablet Take 1-2 tablets by mouth every 4 (four) hours as needed for moderate pain. (Patient not taking: Reported on 09/27/2015) 12 tablet 0   No current facility-administered medications for this visit.     OBJECTIVE: Vitals:   09/27/15 1009  BP: 118/78  Pulse: 76  Resp: 16  Temp: (!) 95.6 F (35.3 C)     Body mass index is 26.3 kg/m.    ECOG FS:0 - Asymptomatic  General: Well-developed, well-nourished, no acute distress. Eyes: Pink conjunctiva, anicteric sclera. Lungs: Clear to auscultation bilaterally. Heart: Regular rate and rhythm. No rubs, murmurs, or gallops. Breasts: Well healing surgical scar on left breast. Abdomen: Soft, nontender, nondistended. No organomegaly noted, normoactive bowel sounds. Musculoskeletal: No edema, cyanosis, or clubbing. Neuro: Alert, answering all questions appropriately. Cranial nerves grossly intact. Skin: Itchy dry skin rash, no erythema Psych: Normal affect.  LAB RESULTS:  Lab Results  Component Value Date   NA 135 08/02/2015   K 4.7 08/02/2015   CL 104 08/02/2015   CO2 27 08/02/2015   GLUCOSE 84 08/02/2015   BUN 16 08/02/2015   CREATININE 0.71 08/02/2015   CALCIUM 9.3 08/02/2015   PROT 6.5 08/02/2015   ALBUMIN 3.9 08/02/2015   AST 20 08/02/2015   ALT 14 08/02/2015   ALKPHOS 48 08/02/2015   BILITOT 0.1 (L) 08/02/2015   GFRNONAA >60 08/02/2015   GFRAA >60 08/02/2015    Lab Results  Component Value Date   WBC 3.9 08/02/2015   NEUTROABS 2.7 08/02/2015   HGB 11.6 (L) 08/02/2015   HCT 32.8 (L) 08/02/2015   MCV 98.8 08/02/2015   PLT 283 08/02/2015      STUDIES: Nm Sentinel Node Injection  Result Date: 09/14/2015 CLINICAL DATA:  Left breast cancer. EXAM: NUCLEAR MEDICINE BREAST LYMPHOSCINTIGRAPHY LEFT BREAST TECHNIQUE: Intradermal injection of radiopharmaceutical was performed along the outer aspect of the left nipple. The patient was then sent to the operating room where the sentinel node(s) were identified and removed by the surgeon. RADIOPHARMACEUTICALS:  Total of 1 mCi Millipore-filtered Technetium-37msulfur colloid injected. IMPRESSION: Uncomplicated intradermal injection of a total of 1 mCi Technetium-974mulfur colloid for purposes of sentinel node identification. Electronically Signed   By: ThMarcello MooresRegister   On: 09/14/2015 10:28   Mm Breast Surgical Specimen  Result Date: 09/14/2015 CLINICAL DATA:  [age]ear old female status post left lumpectomy for invasive lobular carcinoma and left excisional biopsy for lobular carcinoma in situ. EXAM: SPECIMEN RADIOGRAPH OF THE LEFT BREAST COMPARISON:  Previous exam(s). FINDINGS: Status post excision of the left breast. The wire tip and top hat shaped biopsy marker clip are present and are marked  for pathology. IMPRESSION: Specimen radiograph of the left breast. Electronically Signed   By: Pamelia Hoit M.D.   On: 09/14/2015 13:48   Mm Breast Surgical Specimen  Result Date: 09/14/2015 CLINICAL DATA:  60 year old female status post left lumpectomy for invasive lobular carcinoma and left excisional biopsy for lobular carcinoma in situ. EXAM: SPECIMEN RADIOGRAPH OF THE LEFT BREAST COMPARISON:  Previous exam(s). FINDINGS: Status post lumpectomy of the upper, inner left breast. The wire tips and wing shaped biopsy marker clip are present and are marked for pathology. IMPRESSION: Specimen radiograph of the left breast. Electronically Signed   By: Pamelia Hoit M.D.   On: 09/14/2015 13:47   Mm Lt Plc Breast Loc Dev   1st Lesion  Inc Mammo Guide  Result Date: 09/14/2015 CLINICAL DATA:  60 year old female with  biopsy proven invasive lobular carcinoma and LCIS of the upper, inner left breast. Three cm of associated punctate calcifications were noted extending anteriorly from the site of [place] on the initial diagnostic mammogram. The patient had a separate biopsy of calcifications in the upper, outer left breast demonstrating LCIS. EXAM: NEEDLE LOCALIZATION OF THE LEFT BREAST WITH MAMMO GUIDANCE COMPARISON:  Previous exams. FINDINGS: Patient presents for needle localization prior to left lumpectomy. I met with the patient and we discussed the procedure of needle localization including benefits and alternatives. We discussed the high likelihood of a successful procedure. We discussed the risks of the procedure, including infection, bleeding, tissue injury, and further surgery. Informed, written consent was given. The usual time-out protocol was performed immediately prior to the procedure. Using mammographic guidance, sterile technique, 1% lidocaine and a 9 cm modified Kopans needle, the wing shaped biopsy marker within the upper, inner left breast was localized using a superior to inferior approach. Using similar technique, the anterior extent of the associated punctate calcifications, also within the upper, inner left breast was localized using a 9 cm modified Kopan's needle and a superior to inferior approach. Again using similar technique, a 9 cm modified Kopan's needle was used to localize the top hat shaped biopsy marker in the upper, outer left breast using a superior to inferior approach. The images were marked for Dr. Tamala Julian. IMPRESSION: 1. Bracket localization of the site of invasive lobular carcinoma in the upper, inner left breast and associated calcifications. 2. Wire localization of the site of LCIS in the upper, outer left breast. Electronically Signed   By: Pamelia Hoit M.D.   On: 09/14/2015 10:20   Mm Lt Plc Breast Loc Dev   Ea Add Lesion  Inc Mammo Guide  Result Date: 09/14/2015 CLINICAL DATA:  60 year old  female with biopsy proven invasive lobular carcinoma and LCIS of the upper, inner left breast. Three cm of associated punctate calcifications were noted extending anteriorly from the site of [place] on the initial diagnostic mammogram. The patient had a separate biopsy of calcifications in the upper, outer left breast demonstrating LCIS. EXAM: NEEDLE LOCALIZATION OF THE LEFT BREAST WITH MAMMO GUIDANCE COMPARISON:  Previous exams. FINDINGS: Patient presents for needle localization prior to left lumpectomy. I met with the patient and we discussed the procedure of needle localization including benefits and alternatives. We discussed the high likelihood of a successful procedure. We discussed the risks of the procedure, including infection, bleeding, tissue injury, and further surgery. Informed, written consent was given. The usual time-out protocol was performed immediately prior to the procedure. Using mammographic guidance, sterile technique, 1% lidocaine and a 9 cm modified Kopans needle, the wing shaped biopsy marker  within the upper, inner left breast was localized using a superior to inferior approach. Using similar technique, the anterior extent of the associated punctate calcifications, also within the upper, inner left breast was localized using a 9 cm modified Kopan's needle and a superior to inferior approach. Again using similar technique, a 9 cm modified Kopan's needle was used to localize the top hat shaped biopsy marker in the upper, outer left breast using a superior to inferior approach. The images were marked for Dr. Tamala Julian. IMPRESSION: 1. Bracket localization of the site of invasive lobular carcinoma in the upper, inner left breast and associated calcifications. 2. Wire localization of the site of LCIS in the upper, outer left breast. Electronically Signed   By: Pamelia Hoit M.D.   On: 09/14/2015 10:20    ASSESSMENT: Pathologic stage IA ER/PR+, invasive lobular carcinoma of the left breast, overlapping  sites.   PLAN:    1. Pathologic stage IA ER/PR+, invasive lobular carcinoma of the left breast, overlapping sites: Patient completed her neoadjuvant chemotherapy on August 02, 2015. She subsequently underwent lumpectomy on September 14, 2015 which revealed residual disease. Patient's initial stage was IIa. She now will proceed with adjuvant XRT and a referral has been made to radiation oncology. Patient will return to clinic at the beginning of November near the conclusion of her XRT for further evaluation and discussion of initiating an aromatase inhibitor.  2. Anemia: Improved. Secondary to chemotherapy. Monitor. 3. Constipation: Controlled with Miralax PRN  Approximately 30 minutes was spent in discussion of which greater than 50% was consultation.  Patient expressed understanding and was in agreement with this plan. She also understands that She can call clinic at any time with any questions, concerns, or complaints.   Breast cancer [facility])   Staging form: Breast, AJCC 7th Edition     Clinical stage from 02/12/2015: Stage IIA (T2, N0, M0) - Signed by Lloyd Huger, MD on 02/12/2015   Lloyd Huger, MD   09/30/2015 9:23 AM

## 2015-09-27 ENCOUNTER — Inpatient Hospital Stay (HOSPITAL_BASED_OUTPATIENT_CLINIC_OR_DEPARTMENT_OTHER): Payer: Managed Care, Other (non HMO) | Admitting: Oncology

## 2015-09-27 ENCOUNTER — Encounter: Payer: Self-pay | Admitting: Oncology

## 2015-09-27 VITALS — BP 118/78 | HR 76 | Temp 95.6°F | Resp 16 | Ht 64.0 in | Wt 153.2 lb

## 2015-09-27 DIAGNOSIS — R251 Tremor, unspecified: Secondary | ICD-10-CM

## 2015-09-27 DIAGNOSIS — Z8669 Personal history of other diseases of the nervous system and sense organs: Secondary | ICD-10-CM

## 2015-09-27 DIAGNOSIS — Z170 Estrogen receptor positive status [ER+]: Secondary | ICD-10-CM

## 2015-09-27 DIAGNOSIS — Z452 Encounter for adjustment and management of vascular access device: Secondary | ICD-10-CM

## 2015-09-27 DIAGNOSIS — C50812 Malignant neoplasm of overlapping sites of left female breast: Secondary | ICD-10-CM

## 2015-09-27 DIAGNOSIS — M818 Other osteoporosis without current pathological fracture: Secondary | ICD-10-CM

## 2015-09-27 DIAGNOSIS — M129 Arthropathy, unspecified: Secondary | ICD-10-CM

## 2015-09-27 DIAGNOSIS — Z9012 Acquired absence of left breast and nipple: Secondary | ICD-10-CM

## 2015-09-27 DIAGNOSIS — Z79899 Other long term (current) drug therapy: Secondary | ICD-10-CM

## 2015-09-27 DIAGNOSIS — K5900 Constipation, unspecified: Secondary | ICD-10-CM

## 2015-09-27 DIAGNOSIS — Z809 Family history of malignant neoplasm, unspecified: Secondary | ICD-10-CM

## 2015-09-27 DIAGNOSIS — K219 Gastro-esophageal reflux disease without esophagitis: Secondary | ICD-10-CM

## 2015-09-27 DIAGNOSIS — F419 Anxiety disorder, unspecified: Secondary | ICD-10-CM

## 2015-09-27 DIAGNOSIS — D6481 Anemia due to antineoplastic chemotherapy: Secondary | ICD-10-CM

## 2015-09-27 NOTE — Progress Notes (Signed)
Saw surgeon this AM and has no concerns.

## 2015-10-07 ENCOUNTER — Encounter: Payer: Self-pay | Admitting: Radiation Oncology

## 2015-10-07 ENCOUNTER — Ambulatory Visit
Admission: RE | Admit: 2015-10-07 | Discharge: 2015-10-07 | Disposition: A | Discharge status: Home / Self Care | Payer: Managed Care, Other (non HMO) | Source: Ambulatory Visit | Attending: Radiation Oncology | Admitting: Radiation Oncology

## 2015-10-07 VITALS — BP 112/72 | HR 82 | Temp 98.0°F | Wt 152.9 lb

## 2015-10-07 DIAGNOSIS — K219 Gastro-esophageal reflux disease without esophagitis: Secondary | ICD-10-CM | POA: Diagnosis not present

## 2015-10-07 DIAGNOSIS — M818 Other osteoporosis without current pathological fracture: Secondary | ICD-10-CM | POA: Diagnosis not present

## 2015-10-07 DIAGNOSIS — F419 Anxiety disorder, unspecified: Secondary | ICD-10-CM | POA: Diagnosis not present

## 2015-10-07 DIAGNOSIS — Z510 Encounter for antineoplastic radiation therapy: Secondary | ICD-10-CM | POA: Insufficient documentation

## 2015-10-07 DIAGNOSIS — Z9012 Acquired absence of left breast and nipple: Secondary | ICD-10-CM | POA: Diagnosis not present

## 2015-10-07 DIAGNOSIS — C50812 Malignant neoplasm of overlapping sites of left female breast: Secondary | ICD-10-CM | POA: Insufficient documentation

## 2015-10-07 DIAGNOSIS — Z9221 Personal history of antineoplastic chemotherapy: Secondary | ICD-10-CM | POA: Insufficient documentation

## 2015-10-07 DIAGNOSIS — Z170 Estrogen receptor positive status [ER+]: Secondary | ICD-10-CM | POA: Insufficient documentation

## 2015-10-07 DIAGNOSIS — Z809 Family history of malignant neoplasm, unspecified: Secondary | ICD-10-CM | POA: Insufficient documentation

## 2015-10-07 NOTE — Progress Notes (Signed)
Written and verbal information given regarding side effects, lab appointments and hygiene.  Patient and husband verbalized understanding of all information; questions answered to their satisfaction.  15 minutes spent answering questions and giving information

## 2015-10-07 NOTE — Consult Note (Signed)
Except an outstanding is perfect of Radiation Oncology NEW PATIENT EVALUATION  Name: Marisa Evans  MRN: [id number]  Date:   10/07/2015     DOB: 1955-03-03   This 60 y.o. female patient presents to the clinic for initial evaluation of stage I a ER/PR positive invasive lobular carcinoma left breast status post neoadjuvant chemotherapy than wide local excision and sentinel node biopsy now for consideration of whole breast radiation.  REFERRING PHYSICIAN: Adin Hector, MD  CHIEF COMPLAINT:  Chief Complaint  Patient presents with  . Breast Cancer    initial consult for radiation treatment    DIAGNOSIS: The encounter diagnosis was Cancer of overlapping sites of left female breast ([place]).   PREVIOUS INVESTIGATIONS:  Mammograms ultrasound and MRI scans of the breast reviewed Pathology reports reviewed Clinical notes reviewed  HPI: Patient is a 60 year old female who presented with an abnormal mammogram of the left breast with punctate calcifications measuring up to 3 cm in the 11:00 position 4 cm from the nipple. Targeted ultrasound confirmed a 2.0 cm mass again at 11:00 position. Ultrasound-guided core biopsy was positive for invasive lobular carcinoma. With diffuse lobular carcinoma in situ with scattered microcalcifications noted. MRI scan showed a 1.9 cm irregular enhancing mass in the upper inner quadrant of the left breast are spicy biopsy-proven invasive site. There was also a 1.4 cm asymmetric non-mass-like enhancement in the upper outer quadrant of the left breast. Patient went on to have new adjuvant chemotherapy with Cytoxan and Adriamycin and Taxotere. She then went on to have a wide local excision and sentinel node biopsy. There was 1.2 cm of residual lobular carcinoma noted. Tumor was overall grade 3. Tumor was clear but close at less than 1 mm. Tumor was ER/PR positive HER-2/neu not overexpressed. She has done well postoperatively. She specifically denies breast tenderness cough  or bone pain. She is now referred to radiation oncology for opinion.  PLANNED TREATMENT REGIMEN: Whole breast radiation  PAST MEDICAL HISTORY:  has a past medical history of Anxiety; Arthritis; Breast cancer ([place]) (09/14/2015); Cancer ([place]); Family history of adverse reaction to anesthesia; GERD (gastroesophageal reflux disease); H/O breast biopsy; H/O tubal ligation; Migraine; Osteoporosis; and Tremor.    PAST SURGICAL HISTORY:  Past Surgical History:  Procedure Laterality Date  . BREAST BIOPSY Bilateral 1996   neg  . BREAST BIOPSY Left 2017   positive  . PARTIAL MASTECTOMY WITH AXILLARY SENTINEL LYMPH NODE BIOPSY AND NEEDLE Left 09/14/2015   Procedure: PARTIAL MASTECTOMY WITH AXILLARY SENTINEL LYMPH NODE BIOPSY AND NEEDLE LOCALIZATION AND MAMMOSITE EVAL DEVICE;  Surgeon: Leonie Green, MD;  Location: [facility] ORS;  Service: General;  Laterality: Left;  . PORTACATH PLACEMENT Right 03/02/2015   Procedure: INSERTION PORT-A-CATH;  Surgeon: Leonie Green, MD;  Location: [facility] ORS;  Service: General;  Laterality: Right;  . TUBAL LIGATION    . WRIST FRACTURE SURGERY Right 2014   titanium plate in right wrist    FAMILY HISTORY: family history includes Cancer in her maternal uncle and paternal uncle; Hypertension in her father; Liver disease in her paternal grandfather.  SOCIAL HISTORY:  reports that she has never smoked. She has never used smokeless tobacco. She reports that she drinks alcohol. She reports that she does not use drugs.  ALLERGIES: Codeine and Topamax [topiramate]  MEDICATIONS:  Current Outpatient Prescriptions  Medication Sig Dispense Refill  . alendronate (FOSAMAX) 70 MG tablet TAKE 1 TABLET BY MOUTH EVERY WEEK-FRIDAY    . calcium-vitamin D 250-100 MG-UNIT tablet Take  1 tablet by mouth 2 (two) times daily. Reported on 02/25/2015    . Cholecalciferol (D 1000) 1000 units capsule Take 1,000 Units by mouth daily.    . Glucosamine-Chondroitin-MSM 375-300-250 MG TABS Take 1  tablet by mouth daily.    Marland Kitchen HYDROcodone-acetaminophen (NORCO) 5-325 MG tablet Take 1-2 tablets by mouth every 4 (four) hours as needed for moderate pain. (Patient not taking: Reported on 09/27/2015) 12 tablet 0  . lidocaine-prilocaine (EMLA) cream Apply 1 application topically as needed. Apply to port then cover with saran wrap, 1-2 hours before chemotherapy appointment 30 g 2  . propranolol (INDERAL) 10 MG tablet Take 10 mg by mouth 2 (two) times daily. PT TAKES THIS FOR TREMORS    . venlafaxine XR (EFFEXOR-XR) 150 MG 24 hr capsule TAKE ONE CAPSULE BY MOUTH EVERY DAY-PM     No current facility-administered medications for this encounter.     ECOG PERFORMANCE STATUS:  0 - Asymptomatic  REVIEW OF SYSTEMS:  Patient denies any weight loss, fatigue, weakness, fever, chills or night sweats. Patient denies any loss of vision, blurred vision. Patient denies any ringing  of the ears or hearing loss. No irregular heartbeat. Patient denies heart murmur or history of fainting. Patient denies any chest pain or pain radiating to her upper extremities. Patient denies any shortness of breath, difficulty breathing at night, cough or hemoptysis. Patient denies any swelling in the lower legs. Patient denies any nausea vomiting, vomiting of blood, or coffee ground material in the vomitus. Patient denies any stomach pain. Patient states has had normal bowel movements no significant constipation or diarrhea. Patient denies any dysuria, hematuria or significant nocturia. Patient denies any problems walking, swelling in the joints or loss of balance. Patient denies any skin changes, loss of hair or loss of weight. Patient denies any excessive worrying or anxiety or significant depression. Patient denies any problems with insomnia. Patient denies excessive thirst, polyuria, polydipsia. Patient denies any swollen glands, patient denies easy bruising or easy bleeding. Patient denies any recent infections, allergies or URI. Patient  "s visual fields have not changed significantly in recent time.    PHYSICAL EXAM: BP 112/72   Pulse 82   Temp 98 F (36.7 C)   Wt 152 lb 14.2 oz (69.3 kg)   BMI 26.24 kg/m  Patient is status post wide local excision of the left breast. Incision is well-healed no dominant mass or nodularity is noted in either breast in 2 positions examined. No axillary or supraclavicular adenopathy is noted. Well-developed well-nourished patient in NAD. HEENT reveals PERLA, EOMI, discs not visualized.  Oral cavity is clear. No oral mucosal lesions are identified. Neck is clear without evidence of cervical or supraclavicular adenopathy. Lungs are clear to A&P. Cardiac examination is essentially unremarkable with regular rate and rhythm without murmur rub or thrill. Abdomen is benign with no organomegaly or masses noted. Motor sensory and DTR levels are equal and symmetric in the upper and lower extremities. Cranial nerves II through XII are grossly intact. Proprioception is intact. No peripheral adenopathy or edema is identified. No motor or sensory levels are noted. Crude visual fields are within normal range.  LABORATORY DATA: Pathology reports reviewed    RADIOLOGY RESULTS: Mammogram ultrasound and MRI scans reviewed   IMPRESSION: Stage I invasive mammary lobular carcinoma of the left breast status post new adjuvant chemotherapy than wide local excision and sentinel node biopsy for a stage I (T1 CN 0 M0) ER/PR positive lobular carcinoma invasive of the left breast status  post neoadjuvant chemotherapy and wide local excision  PLAN: At this time I have recommended whole breast radiation. Patient breast size is suitable for hypofractionated course of treatment over 4 weeks. I also would boost her scar another 1600 cGy using electron based on the close margin of less than 1 mm. Risks and benefits of treatment including skin reaction fatigue alteration of blood counts possible inclusion of superficial lung all were  described in detail to the patient. We will do our best during simulation to avoid her heart exposure. I personally set up and ordered CT simulation on the patient for tomorrow. Patient also will benefit from anti-estrogen therapy after completion of treatment which will be prescribed by medical oncology. Patient seems to comprehend my treatment plan well.  I would like to take this opportunity to thank you for allowing me to participate in the care of your patient.Armstead Peaks., MD

## 2015-10-08 ENCOUNTER — Ambulatory Visit
Admission: RE | Admit: 2015-10-08 | Discharge: 2015-10-08 | Disposition: A | Discharge status: Home / Self Care | Payer: Managed Care, Other (non HMO) | Source: Ambulatory Visit | Attending: Radiation Oncology | Admitting: Radiation Oncology

## 2015-10-08 DIAGNOSIS — C50812 Malignant neoplasm of overlapping sites of left female breast: Secondary | ICD-10-CM | POA: Diagnosis not present

## 2015-10-13 DIAGNOSIS — C50812 Malignant neoplasm of overlapping sites of left female breast: Secondary | ICD-10-CM | POA: Diagnosis not present

## 2015-10-14 ENCOUNTER — Other Ambulatory Visit: Payer: Self-pay | Admitting: *Deleted

## 2015-10-14 DIAGNOSIS — C50812 Malignant neoplasm of overlapping sites of left female breast: Secondary | ICD-10-CM

## 2015-10-18 ENCOUNTER — Ambulatory Visit
Admission: RE | Admit: 2015-10-18 | Discharge: 2015-10-18 | Disposition: A | Discharge status: Home / Self Care | Payer: Managed Care, Other (non HMO) | Source: Ambulatory Visit | Attending: Radiation Oncology | Admitting: Radiation Oncology

## 2015-10-18 ENCOUNTER — Inpatient Hospital Stay: Payer: Managed Care, Other (non HMO) | Attending: Oncology

## 2015-10-18 DIAGNOSIS — Z95828 Presence of other vascular implants and grafts: Secondary | ICD-10-CM

## 2015-10-18 DIAGNOSIS — C50812 Malignant neoplasm of overlapping sites of left female breast: Secondary | ICD-10-CM | POA: Diagnosis present

## 2015-10-18 MED ORDER — SODIUM CHLORIDE 0.9% FLUSH
10.0000 mL | INTRAVENOUS | Status: DC | PRN
  Administered 2015-10-18: 10 mL via INTRAVENOUS
  Filled 2015-10-18: qty 10

## 2015-10-18 MED ORDER — HEPARIN SOD (PORK) LOCK FLUSH 100 UNIT/ML IV SOLN
500.0000 [IU] | Freq: Once | INTRAVENOUS | Status: AC
Start: 2015-10-18 — End: 2015-10-18
  Administered 2015-10-18: 500 [IU] via INTRAVENOUS

## 2015-10-19 ENCOUNTER — Ambulatory Visit
Admission: RE | Admit: 2015-10-19 | Discharge: 2015-10-19 | Disposition: A | Discharge status: Home / Self Care | Payer: Managed Care, Other (non HMO) | Source: Ambulatory Visit | Attending: Radiation Oncology | Admitting: Radiation Oncology

## 2015-10-19 ENCOUNTER — Encounter: Payer: Self-pay | Admitting: Oncology

## 2015-10-19 DIAGNOSIS — C50812 Malignant neoplasm of overlapping sites of left female breast: Secondary | ICD-10-CM | POA: Diagnosis not present

## 2015-10-20 ENCOUNTER — Ambulatory Visit
Admission: RE | Admit: 2015-10-20 | Discharge: 2015-10-20 | Disposition: A | Discharge status: Home / Self Care | Payer: Managed Care, Other (non HMO) | Source: Ambulatory Visit | Attending: Radiation Oncology | Admitting: Radiation Oncology

## 2015-10-20 DIAGNOSIS — C50812 Malignant neoplasm of overlapping sites of left female breast: Secondary | ICD-10-CM | POA: Diagnosis not present

## 2015-10-21 ENCOUNTER — Ambulatory Visit
Admission: RE | Admit: 2015-10-21 | Discharge: 2015-10-21 | Disposition: A | Discharge status: Home / Self Care | Payer: Managed Care, Other (non HMO) | Source: Ambulatory Visit | Attending: Radiation Oncology | Admitting: Radiation Oncology

## 2015-10-21 DIAGNOSIS — C50812 Malignant neoplasm of overlapping sites of left female breast: Secondary | ICD-10-CM | POA: Diagnosis not present

## 2015-10-22 ENCOUNTER — Ambulatory Visit
Admission: RE | Admit: 2015-10-22 | Discharge: 2015-10-22 | Disposition: A | Discharge status: Home / Self Care | Payer: Managed Care, Other (non HMO) | Source: Ambulatory Visit | Attending: Radiation Oncology | Admitting: Radiation Oncology

## 2015-10-22 DIAGNOSIS — C50812 Malignant neoplasm of overlapping sites of left female breast: Secondary | ICD-10-CM | POA: Diagnosis not present

## 2015-10-25 ENCOUNTER — Ambulatory Visit
Admission: RE | Admit: 2015-10-25 | Discharge: 2015-10-25 | Disposition: A | Discharge status: Home / Self Care | Payer: Managed Care, Other (non HMO) | Source: Ambulatory Visit | Attending: Radiation Oncology | Admitting: Radiation Oncology

## 2015-10-25 DIAGNOSIS — C50812 Malignant neoplasm of overlapping sites of left female breast: Secondary | ICD-10-CM | POA: Diagnosis not present

## 2015-10-26 ENCOUNTER — Ambulatory Visit
Admission: RE | Admit: 2015-10-26 | Discharge: 2015-10-26 | Disposition: A | Discharge status: Home / Self Care | Payer: Managed Care, Other (non HMO) | Source: Ambulatory Visit | Attending: Radiation Oncology | Admitting: Radiation Oncology

## 2015-10-26 DIAGNOSIS — C50812 Malignant neoplasm of overlapping sites of left female breast: Secondary | ICD-10-CM | POA: Diagnosis not present

## 2015-10-27 ENCOUNTER — Ambulatory Visit
Admission: RE | Admit: 2015-10-27 | Discharge: 2015-10-27 | Disposition: A | Discharge status: Home / Self Care | Payer: Managed Care, Other (non HMO) | Source: Ambulatory Visit | Attending: Radiation Oncology | Admitting: Radiation Oncology

## 2015-10-27 DIAGNOSIS — C50812 Malignant neoplasm of overlapping sites of left female breast: Secondary | ICD-10-CM | POA: Diagnosis not present

## 2015-10-28 ENCOUNTER — Inpatient Hospital Stay: Payer: Managed Care, Other (non HMO)

## 2015-10-28 ENCOUNTER — Ambulatory Visit
Admission: RE | Admit: 2015-10-28 | Discharge: 2015-10-28 | Disposition: A | Discharge status: Home / Self Care | Payer: Managed Care, Other (non HMO) | Source: Ambulatory Visit | Attending: Radiation Oncology | Admitting: Radiation Oncology

## 2015-10-28 DIAGNOSIS — C50812 Malignant neoplasm of overlapping sites of left female breast: Secondary | ICD-10-CM | POA: Diagnosis not present

## 2015-10-28 LAB — CBC
HCT: 36 % (ref 35.0–47.0)
HEMOGLOBIN: 12.7 g/dL (ref 12.0–16.0)
MCH: 33.3 pg (ref 26.0–34.0)
MCHC: 35.3 g/dL (ref 32.0–36.0)
MCV: 94.4 fL (ref 80.0–100.0)
Platelets: 261 10*3/uL (ref 150–440)
RBC: 3.81 MIL/uL (ref 3.80–5.20)
RDW: 12.7 % (ref 11.5–14.5)
WBC: 4.1 10*3/uL (ref 3.6–11.0)

## 2015-10-29 ENCOUNTER — Ambulatory Visit
Admission: RE | Admit: 2015-10-29 | Discharge: 2015-10-29 | Disposition: A | Discharge status: Home / Self Care | Payer: Managed Care, Other (non HMO) | Source: Ambulatory Visit | Attending: Radiation Oncology | Admitting: Radiation Oncology

## 2015-10-29 DIAGNOSIS — C50812 Malignant neoplasm of overlapping sites of left female breast: Secondary | ICD-10-CM | POA: Diagnosis not present

## 2015-11-01 ENCOUNTER — Ambulatory Visit
Admission: RE | Admit: 2015-11-01 | Discharge: 2015-11-01 | Disposition: A | Discharge status: Home / Self Care | Payer: Managed Care, Other (non HMO) | Source: Ambulatory Visit | Attending: Radiation Oncology | Admitting: Radiation Oncology

## 2015-11-01 DIAGNOSIS — C50812 Malignant neoplasm of overlapping sites of left female breast: Secondary | ICD-10-CM | POA: Diagnosis not present

## 2015-11-02 ENCOUNTER — Ambulatory Visit
Admission: RE | Admit: 2015-11-02 | Discharge: 2015-11-02 | Disposition: A | Discharge status: Home / Self Care | Payer: Managed Care, Other (non HMO) | Source: Ambulatory Visit | Attending: Radiation Oncology | Admitting: Radiation Oncology

## 2015-11-02 DIAGNOSIS — C50812 Malignant neoplasm of overlapping sites of left female breast: Secondary | ICD-10-CM | POA: Diagnosis not present

## 2015-11-03 ENCOUNTER — Ambulatory Visit
Admission: RE | Admit: 2015-11-03 | Discharge: 2015-11-03 | Disposition: A | Discharge status: Home / Self Care | Payer: Managed Care, Other (non HMO) | Source: Ambulatory Visit | Attending: Radiation Oncology | Admitting: Radiation Oncology

## 2015-11-03 DIAGNOSIS — C50812 Malignant neoplasm of overlapping sites of left female breast: Secondary | ICD-10-CM | POA: Diagnosis not present

## 2015-11-04 ENCOUNTER — Ambulatory Visit
Admission: RE | Admit: 2015-11-04 | Discharge: 2015-11-04 | Disposition: A | Discharge status: Home / Self Care | Payer: Managed Care, Other (non HMO) | Source: Ambulatory Visit | Attending: Radiation Oncology | Admitting: Radiation Oncology

## 2015-11-04 DIAGNOSIS — C50812 Malignant neoplasm of overlapping sites of left female breast: Secondary | ICD-10-CM | POA: Diagnosis not present

## 2015-11-05 ENCOUNTER — Ambulatory Visit
Admission: RE | Admit: 2015-11-05 | Discharge: 2015-11-05 | Disposition: A | Discharge status: Home / Self Care | Payer: Managed Care, Other (non HMO) | Source: Ambulatory Visit | Attending: Radiation Oncology | Admitting: Radiation Oncology

## 2015-11-05 DIAGNOSIS — C50812 Malignant neoplasm of overlapping sites of left female breast: Secondary | ICD-10-CM | POA: Diagnosis not present

## 2015-11-08 ENCOUNTER — Ambulatory Visit
Admission: RE | Admit: 2015-11-08 | Discharge: 2015-11-08 | Disposition: A | Discharge status: Home / Self Care | Payer: Managed Care, Other (non HMO) | Source: Ambulatory Visit | Attending: Radiation Oncology | Admitting: Radiation Oncology

## 2015-11-08 DIAGNOSIS — C50812 Malignant neoplasm of overlapping sites of left female breast: Secondary | ICD-10-CM | POA: Diagnosis not present

## 2015-11-09 ENCOUNTER — Ambulatory Visit
Admission: RE | Admit: 2015-11-09 | Discharge: 2015-11-09 | Disposition: A | Discharge status: Home / Self Care | Payer: Managed Care, Other (non HMO) | Source: Ambulatory Visit | Attending: Radiation Oncology | Admitting: Radiation Oncology

## 2015-11-09 DIAGNOSIS — C50812 Malignant neoplasm of overlapping sites of left female breast: Secondary | ICD-10-CM | POA: Diagnosis not present

## 2015-11-10 ENCOUNTER — Ambulatory Visit
Admission: RE | Admit: 2015-11-10 | Discharge: 2015-11-10 | Disposition: A | Discharge status: Home / Self Care | Payer: Managed Care, Other (non HMO) | Source: Ambulatory Visit | Attending: Radiation Oncology | Admitting: Radiation Oncology

## 2015-11-10 DIAGNOSIS — C50812 Malignant neoplasm of overlapping sites of left female breast: Secondary | ICD-10-CM | POA: Diagnosis not present

## 2015-11-11 ENCOUNTER — Inpatient Hospital Stay: Payer: Managed Care, Other (non HMO) | Attending: Oncology

## 2015-11-11 ENCOUNTER — Ambulatory Visit
Admission: RE | Admit: 2015-11-11 | Discharge: 2015-11-11 | Disposition: A | Discharge status: Home / Self Care | Payer: Managed Care, Other (non HMO) | Source: Ambulatory Visit | Attending: Radiation Oncology | Admitting: Radiation Oncology

## 2015-11-11 DIAGNOSIS — Z170 Estrogen receptor positive status [ER+]: Secondary | ICD-10-CM | POA: Insufficient documentation

## 2015-11-11 DIAGNOSIS — C50912 Malignant neoplasm of unspecified site of left female breast: Secondary | ICD-10-CM

## 2015-11-11 DIAGNOSIS — C50812 Malignant neoplasm of overlapping sites of left female breast: Secondary | ICD-10-CM | POA: Insufficient documentation

## 2015-11-11 LAB — CBC WITH DIFFERENTIAL/PLATELET
BASOS ABS: 0 10*3/uL (ref 0–0.1)
BASOS PCT: 1 %
EOS ABS: 0.1 10*3/uL (ref 0–0.7)
EOS PCT: 2 %
HEMATOCRIT: 35.4 % (ref 35.0–47.0)
Hemoglobin: 12.4 g/dL (ref 12.0–16.0)
Lymphocytes Relative: 19 %
Lymphs Abs: 0.6 10*3/uL — ABNORMAL LOW (ref 1.0–3.6)
MCH: 33.1 pg (ref 26.0–34.0)
MCHC: 35.2 g/dL (ref 32.0–36.0)
MCV: 94 fL (ref 80.0–100.0)
MONO ABS: 0.6 10*3/uL (ref 0.2–0.9)
MONOS PCT: 18 %
NEUTROS ABS: 2 10*3/uL (ref 1.4–6.5)
Neutrophils Relative %: 60 %
PLATELETS: 227 10*3/uL (ref 150–440)
RBC: 3.76 MIL/uL — ABNORMAL LOW (ref 3.80–5.20)
RDW: 12.8 % (ref 11.5–14.5)
WBC: 3.4 10*3/uL — ABNORMAL LOW (ref 3.6–11.0)

## 2015-11-11 LAB — COMPREHENSIVE METABOLIC PANEL
ALBUMIN: 4.1 g/dL (ref 3.5–5.0)
ALT: 16 U/L (ref 14–54)
ANION GAP: 6 (ref 5–15)
AST: 22 U/L (ref 15–41)
Alkaline Phosphatase: 50 U/L (ref 38–126)
BILIRUBIN TOTAL: 0.5 mg/dL (ref 0.3–1.2)
BUN: 11 mg/dL (ref 6–20)
CHLORIDE: 104 mmol/L (ref 101–111)
CO2: 26 mmol/L (ref 22–32)
Calcium: 9.2 mg/dL (ref 8.9–10.3)
Creatinine, Ser: 0.76 mg/dL (ref 0.44–1.00)
GFR calc Af Amer: >60 mL/min (ref >60)
GFR calc non Af Amer: >60 mL/min (ref >60)
GLUCOSE: 80 mg/dL (ref 65–99)
POTASSIUM: 4.9 mmol/L (ref 3.5–5.1)
Sodium: 136 mmol/L (ref 135–145)
TOTAL PROTEIN: 6.7 g/dL (ref 6.5–8.1)

## 2015-11-12 ENCOUNTER — Ambulatory Visit
Admission: RE | Admit: 2015-11-12 | Discharge: 2015-11-12 | Disposition: A | Discharge status: Home / Self Care | Payer: Managed Care, Other (non HMO) | Source: Ambulatory Visit | Attending: Radiation Oncology | Admitting: Radiation Oncology

## 2015-11-12 DIAGNOSIS — C50812 Malignant neoplasm of overlapping sites of left female breast: Secondary | ICD-10-CM | POA: Diagnosis not present

## 2015-11-15 ENCOUNTER — Ambulatory Visit
Admission: RE | Admit: 2015-11-15 | Discharge: 2015-11-15 | Disposition: A | Discharge status: Home / Self Care | Payer: Managed Care, Other (non HMO) | Source: Ambulatory Visit | Attending: Radiation Oncology | Admitting: Radiation Oncology

## 2015-11-15 DIAGNOSIS — C50812 Malignant neoplasm of overlapping sites of left female breast: Secondary | ICD-10-CM | POA: Diagnosis not present

## 2015-11-16 ENCOUNTER — Ambulatory Visit
Admission: RE | Admit: 2015-11-16 | Discharge: 2015-11-16 | Disposition: A | Discharge status: Home / Self Care | Payer: Managed Care, Other (non HMO) | Source: Ambulatory Visit | Attending: Radiation Oncology | Admitting: Radiation Oncology

## 2015-11-16 DIAGNOSIS — C50812 Malignant neoplasm of overlapping sites of left female breast: Secondary | ICD-10-CM | POA: Diagnosis not present

## 2015-11-17 ENCOUNTER — Ambulatory Visit
Admission: RE | Admit: 2015-11-17 | Discharge: 2015-11-17 | Disposition: A | Discharge status: Home / Self Care | Payer: Managed Care, Other (non HMO) | Source: Ambulatory Visit | Attending: Radiation Oncology | Admitting: Radiation Oncology

## 2015-11-17 DIAGNOSIS — C50812 Malignant neoplasm of overlapping sites of left female breast: Secondary | ICD-10-CM | POA: Diagnosis not present

## 2015-11-18 ENCOUNTER — Ambulatory Visit
Admission: RE | Admit: 2015-11-18 | Discharge: 2015-11-18 | Disposition: A | Discharge status: Home / Self Care | Payer: Managed Care, Other (non HMO) | Source: Ambulatory Visit | Attending: Radiation Oncology | Admitting: Radiation Oncology

## 2015-11-18 ENCOUNTER — Ambulatory Visit: Payer: Managed Care, Other (non HMO) | Admitting: Oncology

## 2015-11-18 DIAGNOSIS — C50812 Malignant neoplasm of overlapping sites of left female breast: Secondary | ICD-10-CM | POA: Diagnosis not present

## 2015-11-19 ENCOUNTER — Ambulatory Visit
Admission: RE | Admit: 2015-11-19 | Discharge: 2015-11-19 | Disposition: A | Discharge status: Home / Self Care | Payer: Managed Care, Other (non HMO) | Source: Ambulatory Visit | Attending: Radiation Oncology | Admitting: Radiation Oncology

## 2015-11-19 DIAGNOSIS — C50812 Malignant neoplasm of overlapping sites of left female breast: Secondary | ICD-10-CM | POA: Diagnosis not present

## 2015-11-21 ENCOUNTER — Encounter: Payer: Self-pay | Admitting: Oncology

## 2015-11-22 ENCOUNTER — Ambulatory Visit: Payer: Managed Care, Other (non HMO) | Admitting: Oncology

## 2015-12-05 NOTE — Progress Notes (Signed)
[place]  Telephone:(336) [phone] Fax:(336) [phone]  ID: LUJANE Evans OB: [birth year]  MR#: [id number]  [id number]  Patient Care Team: Adin Hector, MD as PCP - General (Internal Medicine) Leonie Green, MD as Referring Physician (Surgery)  CHIEF COMPLAINT: Pathologic stage IA ER/PR+, invasive lobular carcinoma of the left breast, overlapping sites  INTERVAL HISTORY: Patient returns to clinic today for further evaluation and initiation of an aromatase inhibitor. She recently completed her XRT and tolerated it well without significant side effects. She currently feels well and is asymptomatic.  She has a good appetite and denies weight loss. She does not complain of peripheral neuropathy. She denies any recent fevers.  She has no chest pain or shortness of breath. She denies any nausea, vomiting, constipation, or diarrhea. She has no urinary complaints. Patient offers no specific complaints today.  REVIEW OF SYSTEMS:   Review of Systems  Constitutional: Negative for fever, malaise/fatigue and weight loss.  HENT: Negative.   Respiratory: Negative.  Negative for shortness of breath.   Cardiovascular: Negative.  Negative for chest pain.  Gastrointestinal: Negative for abdominal pain, constipation, nausea and vomiting.  Musculoskeletal: Negative.   Skin: Negative for itching.  Neurological: Negative for sensory change and weakness.  Psychiatric/Behavioral: The patient is not nervous/anxious.     As per HPI. Otherwise, a complete review of systems is negative.  PAST MEDICAL HISTORY: Past Medical History:  Diagnosis Date  . Anxiety   . Arthritis    knees  . Breast cancer ([place]) 09/14/2015   left breast lumpectomy  . Cancer ([place])   . Family history of adverse reaction to anesthesia    PTS UNCLE-PARALYZED AFTER SURGERY AND IT TOOK A WHILE BEFORE HE COULD MOVE AGAIN  . GERD (gastroesophageal reflux disease)    NO MEDS  . H/O breast biopsy   .  H/O tubal ligation   . Migraine    not a problem as of now...due to hormones  . Osteoporosis   . Tremor    TAKES PROPRANOLOL FOR TREMORS    PAST SURGICAL HISTORY: Past Surgical History:  Procedure Laterality Date  . BREAST BIOPSY Bilateral 1996   neg  . BREAST BIOPSY Left 2017   positive  . PARTIAL MASTECTOMY WITH AXILLARY SENTINEL LYMPH NODE BIOPSY AND NEEDLE Left 09/14/2015   Procedure: PARTIAL MASTECTOMY WITH AXILLARY SENTINEL LYMPH NODE BIOPSY AND NEEDLE LOCALIZATION AND MAMMOSITE EVAL DEVICE;  Surgeon: Leonie Green, MD;  Location: [facility] ORS;  Service: General;  Laterality: Left;  . PORTACATH PLACEMENT Right 03/02/2015   Procedure: INSERTION PORT-A-CATH;  Surgeon: Leonie Green, MD;  Location: [facility] ORS;  Service: General;  Laterality: Right;  . TUBAL LIGATION    . WRIST FRACTURE SURGERY Right 2014   titanium plate in right wrist    FAMILY HISTORY Family History  Problem Relation Age of Onset  . Hypertension Father   . Cancer Maternal Uncle   . Cancer Paternal Uncle   . Liver disease Paternal Grandfather        ADVANCED DIRECTIVES:    HEALTH MAINTENANCE: Social History  Substance Use Topics  . Smoking status: Never Smoker  . Smokeless tobacco: Never Used  . Alcohol use Yes     Comment: RARE     Allergies  Allergen Reactions  . Codeine Nausea Only  . Topamax [Topiramate] Other (See Comments)    Lightheaded, feels "out of body"    Current Outpatient Prescriptions  Medication Sig Dispense Refill  . alendronate (FOSAMAX)  70 MG tablet TAKE 1 TABLET BY MOUTH EVERY WEEK-FRIDAY    . calcium-vitamin D 250-100 MG-UNIT tablet Take 1 tablet by mouth 2 (two) times daily. Reported on 02/25/2015    . Cholecalciferol (D 1000) 1000 units capsule Take 1,000 Units by mouth daily.    . Glucosamine-Chondroitin-MSM 375-300-250 MG TABS Take 1 tablet by mouth daily.    Marland Kitchen lidocaine-prilocaine (EMLA) cream Apply 1 application topically as needed. Apply to port then cover  with saran wrap, 1-2 hours before chemotherapy appointment 30 g 2  . propranolol (INDERAL) 10 MG tablet Take 10 mg by mouth 2 (two) times daily. PT TAKES THIS FOR TREMORS    . venlafaxine XR (EFFEXOR-XR) 150 MG 24 hr capsule TAKE ONE CAPSULE BY MOUTH EVERY DAY-PM    . letrozole (FEMARA) 2.5 MG tablet Take 1 tablet (2.5 mg total) by mouth daily. 30 tablet 6   No current facility-administered medications for this visit.     OBJECTIVE: Vitals:   12/06/15 1004  BP: 122/83  Pulse: 60  Temp: 97.5 F (36.4 C)     Body mass index is 25.28 kg/m.    ECOG FS:0 - Asymptomatic  General: Well-developed, well-nourished, no acute distress. Eyes: Pink conjunctiva, anicteric sclera. Breasts: Patient requested exam be deferred today. Lungs: Clear to auscultation bilaterally. Heart: Regular rate and rhythm. No rubs, murmurs, or gallops. Breasts: Well healing surgical scar on left breast. Abdomen: Soft, nontender, nondistended. No organomegaly noted, normoactive bowel sounds. Musculoskeletal: No edema, cyanosis, or clubbing. Neuro: Alert, answering all questions appropriately. Cranial nerves grossly intact. Skin: Itchy dry skin rash, no erythema Psych: Normal affect.  LAB RESULTS:  Lab Results  Component Value Date   NA 136 11/11/2015   K 4.9 11/11/2015   CL 104 11/11/2015   CO2 26 11/11/2015   GLUCOSE 80 11/11/2015   BUN 11 11/11/2015   CREATININE 0.76 11/11/2015   CALCIUM 9.2 11/11/2015   PROT 6.7 11/11/2015   ALBUMIN 4.1 11/11/2015   AST 22 11/11/2015   ALT 16 11/11/2015   ALKPHOS 50 11/11/2015   BILITOT 0.5 11/11/2015   GFRNONAA >60 11/11/2015   GFRAA >60 11/11/2015    Lab Results  Component Value Date   WBC 3.4 (L) 11/11/2015   NEUTROABS 2.0 11/11/2015   HGB 12.4 11/11/2015   HCT 35.4 11/11/2015   MCV 94.0 11/11/2015   PLT 227 11/11/2015     STUDIES: No results found.  ASSESSMENT: Pathologic stage IA ER/PR+, invasive lobular carcinoma of the left breast, overlapping  sites.   PLAN:    1. Pathologic stage IA ER/PR+, invasive lobular carcinoma of the left breast, overlapping sites: Patient completed her neoadjuvant chemotherapy on August 02, 2015. She subsequently underwent lumpectomy on September 14, 2015 which revealed residual disease. Patient's initial clinical stage was IIa. She has also now completed XRT and will be placed on letrozole for total 5 years completing in November 2022. Return to clinic in 3 months for further evaluation.  2. Anemia: Resolved. 3. Osteopenia: Repeat DEXA scan in the next 1-2 weeks. If patient has osteoporosis, will consider initiating tamoxifen instead.  Approximately 30 minutes was spent in discussion of which greater than 50% was consultation.  Patient expressed understanding and was in agreement with this plan. She also understands that She can call clinic at any time with any questions, concerns, or complaints.   Breast cancer [facility])   Staging form: Breast, AJCC 7th Edition     Clinical stage from 02/12/2015: Stage IIA (T2, N0, M0) -  Signed by Lloyd Huger, MD on 02/12/2015   Lloyd Huger, MD   12/06/2015 10:25 AM

## 2015-12-06 ENCOUNTER — Inpatient Hospital Stay: Payer: Managed Care, Other (non HMO) | Attending: Oncology

## 2015-12-06 ENCOUNTER — Inpatient Hospital Stay (HOSPITAL_BASED_OUTPATIENT_CLINIC_OR_DEPARTMENT_OTHER): Payer: Managed Care, Other (non HMO) | Admitting: Oncology

## 2015-12-06 ENCOUNTER — Encounter: Payer: Self-pay | Admitting: Oncology

## 2015-12-06 VITALS — BP 122/83 | HR 60 | Temp 97.5°F | Wt 147.3 lb

## 2015-12-06 DIAGNOSIS — Z8669 Personal history of other diseases of the nervous system and sense organs: Secondary | ICD-10-CM | POA: Insufficient documentation

## 2015-12-06 DIAGNOSIS — Z809 Family history of malignant neoplasm, unspecified: Secondary | ICD-10-CM | POA: Diagnosis not present

## 2015-12-06 DIAGNOSIS — Z923 Personal history of irradiation: Secondary | ICD-10-CM | POA: Diagnosis not present

## 2015-12-06 DIAGNOSIS — C50812 Malignant neoplasm of overlapping sites of left female breast: Secondary | ICD-10-CM | POA: Diagnosis present

## 2015-12-06 DIAGNOSIS — R251 Tremor, unspecified: Secondary | ICD-10-CM | POA: Insufficient documentation

## 2015-12-06 DIAGNOSIS — F419 Anxiety disorder, unspecified: Secondary | ICD-10-CM | POA: Insufficient documentation

## 2015-12-06 DIAGNOSIS — M129 Arthropathy, unspecified: Secondary | ICD-10-CM | POA: Insufficient documentation

## 2015-12-06 DIAGNOSIS — Z79899 Other long term (current) drug therapy: Secondary | ICD-10-CM | POA: Insufficient documentation

## 2015-12-06 DIAGNOSIS — Z170 Estrogen receptor positive status [ER+]: Secondary | ICD-10-CM | POA: Diagnosis not present

## 2015-12-06 DIAGNOSIS — M818 Other osteoporosis without current pathological fracture: Secondary | ICD-10-CM

## 2015-12-06 DIAGNOSIS — K219 Gastro-esophageal reflux disease without esophagitis: Secondary | ICD-10-CM

## 2015-12-06 DIAGNOSIS — Z452 Encounter for adjustment and management of vascular access device: Secondary | ICD-10-CM | POA: Insufficient documentation

## 2015-12-06 DIAGNOSIS — Z9012 Acquired absence of left breast and nipple: Secondary | ICD-10-CM | POA: Diagnosis not present

## 2015-12-06 MED ORDER — SODIUM CHLORIDE 0.9% FLUSH
10.0000 mL | Freq: Once | INTRAVENOUS | Status: AC
  Administered 2015-12-06: 10 mL via INTRAVENOUS
  Filled 2015-12-06: qty 10

## 2015-12-06 MED ORDER — HEPARIN SOD (PORK) LOCK FLUSH 100 UNIT/ML IV SOLN
500.0000 [IU] | Freq: Once | INTRAVENOUS | Status: AC
  Administered 2015-12-06: 500 [IU] via INTRAVENOUS
  Filled 2015-12-06: qty 5

## 2015-12-06 MED ORDER — LETROZOLE 2.5 MG PO TABS: 2.5000 mg | ORAL_TABLET | Freq: Every day | ORAL | 6 refills | Status: DC

## 2015-12-13 ENCOUNTER — Ambulatory Visit
Admission: RE | Admit: 2015-12-13 | Discharge: 2015-12-13 | Disposition: A | Discharge status: Home / Self Care | Payer: Managed Care, Other (non HMO) | Source: Ambulatory Visit | Attending: Oncology | Admitting: Oncology

## 2015-12-13 DIAGNOSIS — Z170 Estrogen receptor positive status [ER+]: Secondary | ICD-10-CM | POA: Insufficient documentation

## 2015-12-13 DIAGNOSIS — C50812 Malignant neoplasm of overlapping sites of left female breast: Secondary | ICD-10-CM | POA: Insufficient documentation

## 2015-12-13 DIAGNOSIS — M810 Age-related osteoporosis without current pathological fracture: Secondary | ICD-10-CM | POA: Insufficient documentation

## 2015-12-16 ENCOUNTER — Encounter: Payer: Self-pay | Admitting: Oncology

## 2015-12-20 ENCOUNTER — Other Ambulatory Visit: Payer: Self-pay | Admitting: Oncology

## 2015-12-20 ENCOUNTER — Other Ambulatory Visit: Payer: Self-pay | Admitting: *Deleted

## 2015-12-20 DIAGNOSIS — R739 Hyperglycemia, unspecified: Secondary | ICD-10-CM | POA: Insufficient documentation

## 2015-12-20 DIAGNOSIS — Z170 Estrogen receptor positive status [ER+]: Principal | ICD-10-CM

## 2015-12-20 DIAGNOSIS — C50812 Malignant neoplasm of overlapping sites of left female breast: Secondary | ICD-10-CM

## 2015-12-20 MED ORDER — TAMOXIFEN CITRATE 20 MG PO TABS: 20.0000 mg | ORAL_TABLET | Freq: Every day | ORAL | 5 refills | Status: DC

## 2015-12-30 ENCOUNTER — Ambulatory Visit
Admission: RE | Admit: 2015-12-30 | Discharge: 2015-12-30 | Disposition: A | Discharge status: Home / Self Care | Payer: Managed Care, Other (non HMO) | Source: Ambulatory Visit | Attending: Radiation Oncology | Admitting: Radiation Oncology

## 2015-12-30 ENCOUNTER — Encounter: Payer: Self-pay | Admitting: Radiation Oncology

## 2015-12-30 VITALS — BP 129/78 | HR 80 | Temp 97.3°F | Resp 20 | Wt 147.7 lb

## 2015-12-30 DIAGNOSIS — Z923 Personal history of irradiation: Secondary | ICD-10-CM | POA: Diagnosis not present

## 2015-12-30 DIAGNOSIS — C50912 Malignant neoplasm of unspecified site of left female breast: Secondary | ICD-10-CM | POA: Diagnosis not present

## 2015-12-30 DIAGNOSIS — Z170 Estrogen receptor positive status [ER+]: Secondary | ICD-10-CM | POA: Insufficient documentation

## 2015-12-30 DIAGNOSIS — Z79810 Long term (current) use of selective estrogen receptor modulators (SERMs): Secondary | ICD-10-CM | POA: Diagnosis not present

## 2015-12-30 DIAGNOSIS — C50812 Malignant neoplasm of overlapping sites of left female breast: Secondary | ICD-10-CM

## 2015-12-30 NOTE — Progress Notes (Signed)
Radiation Oncology Follow up Note  Name: Marisa Evans   Date:   12/30/2015 MRN:  [id number] DOB: May 24, 1955    This 60 y.o. female presents to the clinic today for one-month follow-up status post whole breast radiation for stage I invasive lobular carcinoma the left breast.  REFERRING PROVIDER: Adin Hector, MD  HPI: Patient is a 60 year old female now out 1 month having completed whole breast radiation to her left breast for ER/PR positive invasive lobular carcinoma status post wide local excision and sentinel biopsy after neoadjuvant chemotherapy seen today in routine follow-up she is doing well. She specifically denies breast tenderness cough or bone pain.. She is currently on tamoxifen tolerating that well without side effect.  COMPLICATIONS OF TREATMENT: none  FOLLOW UP COMPLIANCE: keeps appointments   PHYSICAL EXAM:  BP 129/78   Pulse 80   Temp 97.3 F (36.3 C)   Resp 20   Wt 147 lb 11.3 oz (67 kg)   BMI 25.35 kg/m  Lungs are clear to A&P cardiac examination essentially unremarkable with regular rate and rhythm. No dominant mass or nodularity is noted in either breast in 2 positions examined. Incision is well-healed. No axillary or supraclavicular adenopathy is appreciated. Cosmetic result is excellent. Patient still has a Port-A-Cath laced and right anterior chest wall. Well-developed well-nourished patient in NAD. HEENT reveals PERLA, EOMI, discs not visualized.  Oral cavity is clear. No oral mucosal lesions are identified. Neck is clear without evidence of cervical or supraclavicular adenopathy. Lungs are clear to A&P. Cardiac examination is essentially unremarkable with regular rate and rhythm without murmur rub or thrill. Abdomen is benign with no organomegaly or masses noted. Motor sensory and DTR levels are equal and symmetric in the upper and lower extremities. Cranial nerves II through XII are grossly intact. Proprioception is intact. No peripheral adenopathy or  edema is identified. No motor or sensory levels are noted. Crude visual fields are within normal range.  RADIOLOGY RESULTS: No current films for review  PLAN: Present time she is doing well 1 month out from whole breast radiation I'm please were overall progress. I've asked to see her back in 4-5 months for follow-up. She continues on tamoxifen without side effect. Patient knows to call with any concerns.  I would like to take this opportunity to thank you for allowing me to participate in the care of your patient.Armstead Peaks., MD

## 2016-01-17 ENCOUNTER — Inpatient Hospital Stay: Payer: Managed Care, Other (non HMO) | Attending: Oncology

## 2016-01-17 DIAGNOSIS — Z170 Estrogen receptor positive status [ER+]: Secondary | ICD-10-CM | POA: Insufficient documentation

## 2016-01-17 DIAGNOSIS — Z95828 Presence of other vascular implants and grafts: Secondary | ICD-10-CM

## 2016-01-17 DIAGNOSIS — Z79810 Long term (current) use of selective estrogen receptor modulators (SERMs): Secondary | ICD-10-CM | POA: Insufficient documentation

## 2016-01-17 DIAGNOSIS — Z452 Encounter for adjustment and management of vascular access device: Secondary | ICD-10-CM | POA: Diagnosis not present

## 2016-01-17 DIAGNOSIS — C50812 Malignant neoplasm of overlapping sites of left female breast: Secondary | ICD-10-CM | POA: Diagnosis present

## 2016-01-17 MED ORDER — SODIUM CHLORIDE 0.9% FLUSH
10.0000 mL | INTRAVENOUS | Status: DC | PRN
  Administered 2016-01-17: 10 mL via INTRAVENOUS
  Filled 2016-01-17: qty 10

## 2016-01-17 MED ORDER — HEPARIN SOD (PORK) LOCK FLUSH 100 UNIT/ML IV SOLN
500.0000 [IU] | Freq: Once | INTRAVENOUS | Status: AC
  Administered 2016-01-17: 500 [IU] via INTRAVENOUS

## 2016-02-08 ENCOUNTER — Other Ambulatory Visit: Payer: Self-pay | Admitting: Surgery

## 2016-02-08 DIAGNOSIS — Z853 Personal history of malignant neoplasm of breast: Secondary | ICD-10-CM

## 2016-03-01 ENCOUNTER — Encounter: Payer: Self-pay | Admitting: Oncology

## 2016-03-01 ENCOUNTER — Ambulatory Visit
Admission: RE | Admit: 2016-03-01 | Discharge: 2016-03-01 | Disposition: A | Discharge status: Home / Self Care | Payer: Managed Care, Other (non HMO) | Source: Ambulatory Visit | Attending: Surgery | Admitting: Surgery

## 2016-03-01 DIAGNOSIS — Z853 Personal history of malignant neoplasm of breast: Secondary | ICD-10-CM | POA: Insufficient documentation

## 2016-03-07 ENCOUNTER — Ambulatory Visit: Payer: Managed Care, Other (non HMO) | Admitting: Oncology

## 2016-03-07 NOTE — Progress Notes (Signed)
[place]  Telephone:(336) [phone] Fax:(336) [phone]  ID: TEKIA BRONIKOWSKI OB: Apr 09, 1955  MR#: [id number]  [id number]  Patient Care Team: Adin Hector, MD as PCP - General (Internal Medicine) Leonie Green, MD as Referring Physician (Surgery)  CHIEF COMPLAINT: Pathologic stage IA ER/PR+, invasive lobular carcinoma of the left breast, overlapping sites  INTERVAL HISTORY: Patient returns to clinic today for routine 3 month evaluation. She was initiated on tamoxifen given her osteoporosis. She is tolerating this well without significant side effects. She currently feels well and is asymptomatic.  She has a good appetite and denies weight loss. She does not complain of peripheral neuropathy. She denies any recent fevers.  She has no chest pain or shortness of breath. She denies any nausea, vomiting, constipation, or diarrhea. She has no urinary complaints. Patient offers no specific complaints today.  REVIEW OF SYSTEMS:   Review of Systems  Constitutional: Negative for fever, malaise/fatigue and weight loss.  HENT: Negative.   Respiratory: Negative.  Negative for shortness of breath.   Cardiovascular: Negative.  Negative for chest pain.  Gastrointestinal: Negative for abdominal pain, constipation, nausea and vomiting.  Musculoskeletal: Negative.   Skin: Negative for itching.  Neurological: Negative for sensory change and weakness.  Psychiatric/Behavioral: The patient is not nervous/anxious.     As per HPI. Otherwise, a complete review of systems is negative.  PAST MEDICAL HISTORY: Past Medical History:  Diagnosis Date  . Anxiety   . Arthritis    knees  . Breast cancer ([place]) 09/14/2015   left breast lumpectomy/chemo and Radiation  . Cancer ([place])   . Family history of adverse reaction to anesthesia    PTS UNCLE-PARALYZED AFTER SURGERY AND IT TOOK A WHILE BEFORE HE COULD MOVE AGAIN  . GERD (gastroesophageal reflux disease)    NO MEDS  . H/O  breast biopsy   . H/O tubal ligation   . Migraine    not a problem as of now...due to hormones  . Osteoporosis   . Tremor    TAKES PROPRANOLOL FOR TREMORS    PAST SURGICAL HISTORY: Past Surgical History:  Procedure Laterality Date  . BREAST BIOPSY Bilateral 1996   neg  . BREAST BIOPSY Left 2017   positive  . PARTIAL MASTECTOMY WITH AXILLARY SENTINEL LYMPH NODE BIOPSY AND NEEDLE Left 09/14/2015   Procedure: PARTIAL MASTECTOMY WITH AXILLARY SENTINEL LYMPH NODE BIOPSY AND NEEDLE LOCALIZATION AND MAMMOSITE EVAL DEVICE;  Surgeon: Leonie Green, MD;  Location: [facility] ORS;  Service: General;  Laterality: Left;  . PORTACATH PLACEMENT Right 03/02/2015   Procedure: INSERTION PORT-A-CATH;  Surgeon: Leonie Green, MD;  Location: [facility] ORS;  Service: General;  Laterality: Right;  . TUBAL LIGATION    . WRIST FRACTURE SURGERY Right 2014   titanium plate in right wrist    FAMILY HISTORY Family History  Problem Relation Age of Onset  . Hypertension Father   . Cancer Maternal Uncle   . Cancer Paternal Uncle   . Liver disease Paternal Grandfather        ADVANCED DIRECTIVES:    HEALTH MAINTENANCE: Social History  Substance Use Topics  . Smoking status: Never Smoker  . Smokeless tobacco: Never Used  . Alcohol use Yes     Comment: RARE     Allergies  Allergen Reactions  . Codeine Nausea Only  . Topamax [Topiramate] Other (See Comments)    Lightheaded, feels "out of body"    Current Outpatient Prescriptions  Medication Sig Dispense Refill  .  alendronate (FOSAMAX) 70 MG tablet TAKE 1 TABLET BY MOUTH EVERY WEEK-FRIDAY    . calcium-vitamin D 250-100 MG-UNIT tablet Take 1 tablet by mouth 2 (two) times daily. Reported on 02/25/2015    . Cholecalciferol (D 1000) 1000 units capsule Take 1,000 Units by mouth daily.    . Glucosamine-Chondroitin-MSM 375-300-250 MG TABS Take 1 tablet by mouth daily.    Marland Kitchen lidocaine-prilocaine (EMLA) cream Apply 1 application topically as needed. Apply  to port then cover with saran wrap, 1-2 hours before chemotherapy appointment 30 g 2  . propranolol (INDERAL) 10 MG tablet Take 10 mg by mouth 2 (two) times daily. PT TAKES THIS FOR TREMORS    . tamoxifen (NOLVADEX) 20 MG tablet Take 1 tablet (20 mg total) by mouth daily. 30 tablet 5  . venlafaxine XR (EFFEXOR-XR) 150 MG 24 hr capsule TAKE ONE CAPSULE BY MOUTH EVERY DAY-PM     No current facility-administered medications for this visit.     OBJECTIVE: Vitals:   03/08/16 1046  BP: 116/80  Pulse: 77  Resp: 18  Temp: 97.3 F (36.3 C)     Body mass index is 25.2 kg/m.    ECOG FS:0 - Asymptomatic  General: Well-developed, well-nourished, no acute distress. Eyes: Pink conjunctiva, anicteric sclera. Breasts: Patient requested exam be deferred today. Lungs: Clear to auscultation bilaterally. Heart: Regular rate and rhythm. No rubs, murmurs, or gallops. Breasts: Well healing surgical scar on left breast. Abdomen: Soft, nontender, nondistended. No organomegaly noted, normoactive bowel sounds. Musculoskeletal: No edema, cyanosis, or clubbing. Neuro: Alert, answering all questions appropriately. Cranial nerves grossly intact. Skin: Itchy dry skin rash, no erythema Psych: Normal affect.  LAB RESULTS:  Lab Results  Component Value Date   NA 136 11/11/2015   K 4.9 11/11/2015   CL 104 11/11/2015   CO2 26 11/11/2015   GLUCOSE 80 11/11/2015   BUN 11 11/11/2015   CREATININE 0.76 11/11/2015   CALCIUM 9.2 11/11/2015   PROT 6.7 11/11/2015   ALBUMIN 4.1 11/11/2015   AST 22 11/11/2015   ALT 16 11/11/2015   ALKPHOS 50 11/11/2015   BILITOT 0.5 11/11/2015   GFRNONAA >60 11/11/2015   GFRAA >60 11/11/2015    Lab Results  Component Value Date   WBC 3.4 (L) 11/11/2015   NEUTROABS 2.0 11/11/2015   HGB 12.4 11/11/2015   HCT 35.4 11/11/2015   MCV 94.0 11/11/2015   PLT 227 11/11/2015     STUDIES: Mm Diag Breast Tomo Bilateral  Result Date: 03/01/2016 CLINICAL DATA:  History of left  breast cancer in 2017 status post lumpectomy and radiation therapy. EXAM: 2D DIGITAL DIAGNOSTIC BILATERAL MAMMOGRAM WITH CAD AND ADJUNCT TOMO COMPARISON:  Previous exam(s). ACR Breast Density Category c: The breast tissue is heterogeneously dense, which may obscure small masses. FINDINGS: There are expected postsurgical changes within the left breast. There are no new dominant masses, suspicious calcifications or secondary signs of malignancy within either breast. Mammographic images were processed with CAD. IMPRESSION: No evidence of malignancy within either breast. Expected postsurgical changes within the left breast. RECOMMENDATION: Bilateral diagnostic mammogram in 1 year. I have discussed the findings and recommendations with the patient. Results were also provided in writing at the conclusion of the visit. If applicable, a reminder letter will be sent to the patient regarding the next appointment. BI-RADS CATEGORY  2: Benign. Electronically Signed   By: Franki Cabot M.D.   On: 03/01/2016 11:41    ASSESSMENT: Pathologic stage IA ER/PR+, invasive lobular carcinoma of the left breast,  overlapping sites.   PLAN:    1. Pathologic stage IA ER/PR+, invasive lobular carcinoma of the left breast, overlapping sites: Patient completed her neoadjuvant chemotherapy on August 02, 2015. She subsequently underwent lumpectomy on September 14, 2015 which revealed residual disease. Patient's initial clinical stage was IIa. She has also now completed XRT. Tamoxifen was initiated secondary to osteoporosis which she will take for total 5 years completing in November 2022. Her most recent mammogram on March 01, 2016 was reported as BI-RADS 2, repeat in January 2019. Return to clinic in 6 months for further evaluation.  2. Anemia: Resolved. 3. Osteoporosis: Bone mineral density completed on December 13, 2015 revealed a T score of -2.5. Continue Fosamax, calcium, and vitamin D.  Patient expressed understanding and was in  agreement with this plan. She also understands that She can call clinic at any time with any questions, concerns, or complaints.   Breast cancer [facility])   Staging form: Breast, AJCC 7th Edition     Clinical stage from 02/12/2015: Stage IIA (T2, N0, M0) - Signed by Lloyd Huger, MD on 02/12/2015   Lloyd Huger, MD   03/12/2016 8:54 AM

## 2016-03-08 ENCOUNTER — Inpatient Hospital Stay: Payer: Managed Care, Other (non HMO) | Attending: Oncology | Admitting: Oncology

## 2016-03-08 VITALS — BP 116/80 | HR 77 | Temp 97.3°F | Resp 18 | Wt 146.8 lb

## 2016-03-08 DIAGNOSIS — Z79810 Long term (current) use of selective estrogen receptor modulators (SERMs): Secondary | ICD-10-CM | POA: Insufficient documentation

## 2016-03-08 DIAGNOSIS — C50812 Malignant neoplasm of overlapping sites of left female breast: Secondary | ICD-10-CM | POA: Diagnosis present

## 2016-03-08 DIAGNOSIS — Z170 Estrogen receptor positive status [ER+]: Secondary | ICD-10-CM | POA: Insufficient documentation

## 2016-03-08 DIAGNOSIS — Z9221 Personal history of antineoplastic chemotherapy: Secondary | ICD-10-CM | POA: Diagnosis not present

## 2016-03-08 DIAGNOSIS — Z79899 Other long term (current) drug therapy: Secondary | ICD-10-CM | POA: Insufficient documentation

## 2016-03-08 DIAGNOSIS — M818 Other osteoporosis without current pathological fracture: Secondary | ICD-10-CM | POA: Insufficient documentation

## 2016-03-08 DIAGNOSIS — Z809 Family history of malignant neoplasm, unspecified: Secondary | ICD-10-CM | POA: Insufficient documentation

## 2016-03-08 DIAGNOSIS — M129 Arthropathy, unspecified: Secondary | ICD-10-CM | POA: Diagnosis not present

## 2016-03-08 DIAGNOSIS — Z923 Personal history of irradiation: Secondary | ICD-10-CM | POA: Diagnosis not present

## 2016-03-08 DIAGNOSIS — R251 Tremor, unspecified: Secondary | ICD-10-CM | POA: Diagnosis not present

## 2016-03-08 DIAGNOSIS — F419 Anxiety disorder, unspecified: Secondary | ICD-10-CM | POA: Diagnosis not present

## 2016-03-08 DIAGNOSIS — K219 Gastro-esophageal reflux disease without esophagitis: Secondary | ICD-10-CM | POA: Insufficient documentation

## 2016-03-08 NOTE — Progress Notes (Signed)
Patient is here for follow up, she is doing well she would like to know when she can have her port out.

## 2016-05-17 ENCOUNTER — Other Ambulatory Visit: Payer: Self-pay | Admitting: Oncology

## 2016-05-17 DIAGNOSIS — Z170 Estrogen receptor positive status [ER+]: Principal | ICD-10-CM

## 2016-05-17 DIAGNOSIS — C50812 Malignant neoplasm of overlapping sites of left female breast: Secondary | ICD-10-CM

## 2016-06-08 ENCOUNTER — Ambulatory Visit
Admission: RE | Admit: 2016-06-08 | Discharge: 2016-06-08 | Disposition: A | Discharge status: Home / Self Care | Payer: Managed Care, Other (non HMO) | Source: Ambulatory Visit | Attending: Radiation Oncology | Admitting: Radiation Oncology

## 2016-06-08 ENCOUNTER — Encounter: Payer: Self-pay | Admitting: Radiation Oncology

## 2016-06-08 VITALS — BP 115/73 | HR 95 | Temp 98.0°F | Resp 20 | Wt 142.4 lb

## 2016-06-08 DIAGNOSIS — C50812 Malignant neoplasm of overlapping sites of left female breast: Secondary | ICD-10-CM | POA: Insufficient documentation

## 2016-06-08 DIAGNOSIS — Z79810 Long term (current) use of selective estrogen receptor modulators (SERMs): Secondary | ICD-10-CM | POA: Diagnosis not present

## 2016-06-08 DIAGNOSIS — Z170 Estrogen receptor positive status [ER+]: Secondary | ICD-10-CM | POA: Insufficient documentation

## 2016-06-08 DIAGNOSIS — Z923 Personal history of irradiation: Secondary | ICD-10-CM | POA: Insufficient documentation

## 2016-06-08 NOTE — Progress Notes (Signed)
Radiation Oncology Follow up Note  Name: Marisa Evans   Date:   06/08/2016 MRN:  [id number] DOB: 04/07/1955    This 61 y.o. female presents to the clinic today for 6 month follow-up status post whole breast radiation to her left breast for ER/PR positive invasive lobular carcinoma status post wide local excision after neoadjuvant chemotherapy.  REFERRING PROVIDER: Adin Hector, MD  HPI: Patient is a 61 year old female now out 6 months having completed whole breast radiation to her left breast for a stage I invasive lobular carcinoma the left breast status post wide local excision and neoadjuvant chemotherapy. She is seen today in routine follow-up is doing extremely well. She specifically denies breast tenderness cough or bone pain.. She had mammograms back in January 2018 which were BI-RADS 2 benign. She's currently on tamoxifen tolerating that well without side effect.  COMPLICATIONS OF TREATMENT: none  FOLLOW UP COMPLIANCE: keeps appointments   PHYSICAL EXAM:  BP 115/73   Pulse 95   Temp 98 F (36.7 C)   Resp 20   Wt 142 lb 6.7 oz (64.6 kg)   BMI 24.45 kg/m  Lungs are clear to A&P cardiac examination essentially unremarkable with regular rate and rhythm. No dominant mass or nodularity is noted in either breast in 2 positions examined. Incision is well-healed. No axillary or supraclavicular adenopathy is appreciated. Cosmetic result is excellent. Well-developed well-nourished patient in NAD. HEENT reveals PERLA, EOMI, discs not visualized.  Oral cavity is clear. No oral mucosal lesions are identified. Neck is clear without evidence of cervical or supraclavicular adenopathy. Lungs are clear to A&P. Cardiac examination is essentially unremarkable with regular rate and rhythm without murmur rub or thrill. Abdomen is benign with no organomegaly or masses noted. Motor sensory and DTR levels are equal and symmetric in the upper and lower extremities. Cranial nerves II through XII are  grossly intact. Proprioception is intact. No peripheral adenopathy or edema is identified. No motor or sensory levels are noted. Crude visual fields are within normal range.  RADIOLOGY RESULTS: Mammograms are reviewed and compatible with the above-stated findings  PLAN: Present time 6 month out she is doing well with no evidence of disease. I've asked to see her back in 6 months and will start once your follow-up visits. She continues on tamoxifen without side effect. Patient knows to call sooner with any concerns.  I would like to take this opportunity to thank you for allowing me to participate in the care of your patient.Armstead Peaks., MD

## 2016-06-12 ENCOUNTER — Encounter: Payer: Self-pay | Admitting: Oncology

## 2016-09-04 NOTE — Progress Notes (Signed)
[place]  [phone] Fax:(336) [phone]  ID: Marisa Evans OB: December 31, 1955  MR#: [id number]  LKT#:[id number]  Patient Care Team: Adin Hector, MD as PCP - General (Internal Medicine) Lloyd Huger, MD as Consulting Physician (Oncology) Theodore Demark, RN as Oncology Nurse Navigator Tamala Julian, Hillery Aldo, MD as Referring Physician (Surgery)  CHIEF COMPLAINT: Pathologic stage IA ER/PR+, invasive lobular carcinoma of the left breast, overlapping sites  INTERVAL HISTORY: Patient returns to clinic today for routine 6 month evaluation. She continues to tolerate tamoxifen well without significant side effects. She currently feels well and is asymptomatic.  She has a good appetite and denies weight loss. She does not complain of peripheral neuropathy. She denies any recent fevers.  She has no chest pain or shortness of breath. She denies any nausea, vomiting, constipation, or diarrhea. She has no urinary complaints. Patient offers no specific complaints today.  REVIEW OF SYSTEMS:   Review of Systems  Constitutional: Negative for fever, malaise/fatigue and weight loss.  HENT: Negative.   Respiratory: Negative.  Negative for shortness of breath.   Cardiovascular: Negative.  Negative for chest pain.  Gastrointestinal: Negative for abdominal pain, constipation, nausea and vomiting.  Musculoskeletal: Negative.   Skin: Negative for itching.  Neurological: Negative for sensory change and weakness.  Psychiatric/Behavioral: The patient is not nervous/anxious.     As per HPI. Otherwise, a complete review of systems is negative.  PAST MEDICAL HISTORY: Past Medical History:  Diagnosis Date  . Anxiety   . Arthritis    knees  . Breast cancer ([place]) 09/14/2015   left breast lumpectomy/chemo and Radiation  . Cancer ([place])   . Family history of adverse reaction to anesthesia    PTS UNCLE-PARALYZED AFTER SURGERY AND IT TOOK A WHILE BEFORE HE COULD MOVE  AGAIN  . GERD (gastroesophageal reflux disease)    NO MEDS  . H/O breast biopsy   . H/O tubal ligation   . Migraine    not a problem as of now...due to hormones  . Osteoporosis   . Tremor    TAKES PROPRANOLOL FOR TREMORS    PAST SURGICAL HISTORY: Past Surgical History:  Procedure Laterality Date  . BREAST BIOPSY Bilateral 1996   neg  . BREAST BIOPSY Left 2017   positive  . PARTIAL MASTECTOMY WITH AXILLARY SENTINEL LYMPH NODE BIOPSY AND NEEDLE Left 09/14/2015   Procedure: PARTIAL MASTECTOMY WITH AXILLARY SENTINEL LYMPH NODE BIOPSY AND NEEDLE LOCALIZATION AND MAMMOSITE EVAL DEVICE;  Surgeon: Leonie Green, MD;  Location: [facility] ORS;  Service: General;  Laterality: Left;  . PORTACATH PLACEMENT Right 03/02/2015   Procedure: INSERTION PORT-A-CATH;  Surgeon: Leonie Green, MD;  Location: [facility] ORS;  Service: General;  Laterality: Right;  . TUBAL LIGATION    . WRIST FRACTURE SURGERY Right 2014   titanium plate in right wrist    FAMILY HISTORY Family History  Problem Relation Age of Onset  . Hypertension Father   . Cancer Maternal Uncle   . Cancer Paternal Uncle   . Liver disease Paternal Grandfather        ADVANCED DIRECTIVES:    HEALTH MAINTENANCE: Social History  Substance Use Topics  . Smoking status: Never Smoker  . Smokeless tobacco: Never Used  . Alcohol use Yes     Comment: RARE     Allergies  Allergen Reactions  . Codeine Nausea Only  . Topamax [Topiramate] Other (See Comments)    Lightheaded, feels "out of body"    Current  Outpatient Prescriptions  Medication Sig Dispense Refill  . alendronate (FOSAMAX) 70 MG tablet TAKE 1 TABLET BY MOUTH EVERY WEEK-FRIDAY    . calcium-vitamin D 250-100 MG-UNIT tablet Take 1 tablet by mouth 2 (two) times daily. Reported on 02/25/2015    . Cholecalciferol (D 1000) 1000 units capsule Take 1,000 Units by mouth daily.    . Glucosamine-Chondroitin-MSM 375-300-250 MG TABS Take 1 tablet by mouth daily.    Marland Kitchen  lidocaine-prilocaine (EMLA) cream Apply 1 application topically as needed. Apply to port then cover with saran wrap, 1-2 hours before chemotherapy appointment 30 g 2  . propranolol (INDERAL) 10 MG tablet Take 10 mg by mouth 2 (two) times daily. PT TAKES THIS FOR TREMORS    . tamoxifen (NOLVADEX) 20 MG tablet TAKE ONE TABLET EVERY DAY 90 tablet 1  . venlafaxine XR (EFFEXOR-XR) 150 MG 24 hr capsule TAKE ONE CAPSULE BY MOUTH EVERY DAY-PM     No current facility-administered medications for this visit.     OBJECTIVE: Vitals:   09/05/16 1055  BP: 138/87  Pulse: 73  Temp: (!) 97.2 F (36.2 C)     Body mass index is 25.68 kg/m.    ECOG FS:0 - Asymptomatic  General: Well-developed, well-nourished, no acute distress. Eyes: Pink conjunctiva, anicteric sclera. Breasts: Patient requested exam be deferred today. Lungs: Clear to auscultation bilaterally. Heart: Regular rate and rhythm. No rubs, murmurs, or gallops. Breasts: Well healing surgical scar on left breast. Abdomen: Soft, nontender, nondistended. No organomegaly noted, normoactive bowel sounds. Musculoskeletal: No edema, cyanosis, or clubbing. Neuro: Alert, answering all questions appropriately. Cranial nerves grossly intact. Skin: No rash or petechiae noted. Psych: Normal affect.  LAB RESULTS:  Lab Results  Component Value Date   NA 136 11/11/2015   K 4.9 11/11/2015   CL 104 11/11/2015   CO2 26 11/11/2015   GLUCOSE 80 11/11/2015   BUN 11 11/11/2015   CREATININE 0.76 11/11/2015   CALCIUM 9.2 11/11/2015   PROT 6.7 11/11/2015   ALBUMIN 4.1 11/11/2015   AST 22 11/11/2015   ALT 16 11/11/2015   ALKPHOS 50 11/11/2015   BILITOT 0.5 11/11/2015   GFRNONAA >60 11/11/2015   GFRAA >60 11/11/2015    Lab Results  Component Value Date   WBC 3.4 (L) 11/11/2015   NEUTROABS 2.0 11/11/2015   HGB 12.4 11/11/2015   HCT 35.4 11/11/2015   MCV 94.0 11/11/2015   PLT 227 11/11/2015     STUDIES: No results found.  ASSESSMENT:  Pathologic stage IA ER/PR+, invasive lobular carcinoma of the left breast, overlapping sites.   PLAN:    1. Pathologic stage IA ER/PR+, invasive lobular carcinoma of the left breast, overlapping sites: Patient completed her neoadjuvant chemotherapy on August 02, 2015. She subsequently underwent lumpectomy on September 14, 2015 which revealed residual disease. Patient's initial clinical stage was IIa. She has also now completed XRT. Tamoxifen was initiated secondary to osteoporosis which she will take for total 5 years completing in November 2022. Her most recent mammogram on March 01, 2016 was reported as BI-RADS 2, repeat in January 2019. Return to clinic in 6 months for further evaluation.  2. Anemia: Resolved. 3. Osteoporosis: Bone mineral density completed on December 13, 2015 revealed a T score of -2.5. Continue Fosamax, calcium, and vitamin D. Repeat in November 2018.  Patient expressed understanding and was in agreement with this plan. She also understands that She can call clinic at any time with any questions, concerns, or complaints.   Breast cancer ([place])  Staging form: Breast, AJCC 7th Edition     Clinical stage from 02/12/2015: Stage IIA (T2, N0, M0) - Signed by Lloyd Huger, MD on 02/12/2015   Lloyd Huger, MD   09/05/2016 11:24 AM

## 2016-09-05 ENCOUNTER — Encounter: Payer: Self-pay | Admitting: Oncology

## 2016-09-05 ENCOUNTER — Other Ambulatory Visit: Payer: Self-pay | Admitting: Oncology

## 2016-09-05 ENCOUNTER — Inpatient Hospital Stay: Payer: Managed Care, Other (non HMO) | Attending: Oncology | Admitting: Oncology

## 2016-09-05 ENCOUNTER — Inpatient Hospital Stay: Payer: Managed Care, Other (non HMO)

## 2016-09-05 VITALS — BP 138/87 | HR 73 | Temp 97.2°F | Wt 149.6 lb

## 2016-09-05 DIAGNOSIS — M810 Age-related osteoporosis without current pathological fracture: Secondary | ICD-10-CM | POA: Insufficient documentation

## 2016-09-05 DIAGNOSIS — Z809 Family history of malignant neoplasm, unspecified: Secondary | ICD-10-CM | POA: Insufficient documentation

## 2016-09-05 DIAGNOSIS — Z8669 Personal history of other diseases of the nervous system and sense organs: Secondary | ICD-10-CM | POA: Insufficient documentation

## 2016-09-05 DIAGNOSIS — Z170 Estrogen receptor positive status [ER+]: Secondary | ICD-10-CM | POA: Diagnosis not present

## 2016-09-05 DIAGNOSIS — F419 Anxiety disorder, unspecified: Secondary | ICD-10-CM | POA: Insufficient documentation

## 2016-09-05 DIAGNOSIS — C50812 Malignant neoplasm of overlapping sites of left female breast: Secondary | ICD-10-CM | POA: Diagnosis not present

## 2016-09-05 DIAGNOSIS — Z79810 Long term (current) use of selective estrogen receptor modulators (SERMs): Secondary | ICD-10-CM | POA: Insufficient documentation

## 2016-09-05 DIAGNOSIS — C50919 Malignant neoplasm of unspecified site of unspecified female breast: Secondary | ICD-10-CM

## 2016-09-05 DIAGNOSIS — Z9012 Acquired absence of left breast and nipple: Secondary | ICD-10-CM | POA: Insufficient documentation

## 2016-09-05 DIAGNOSIS — K219 Gastro-esophageal reflux disease without esophagitis: Secondary | ICD-10-CM | POA: Diagnosis not present

## 2016-09-05 DIAGNOSIS — M818 Other osteoporosis without current pathological fracture: Secondary | ICD-10-CM | POA: Diagnosis not present

## 2016-09-05 DIAGNOSIS — R251 Tremor, unspecified: Secondary | ICD-10-CM | POA: Insufficient documentation

## 2016-09-05 LAB — BASIC METABOLIC PANEL
ANION GAP: 6 (ref 5–15)
BUN: 17 mg/dL (ref 6–20)
CO2: 26 mmol/L (ref 22–32)
Calcium: 9 mg/dL (ref 8.9–10.3)
Chloride: 100 mmol/L — ABNORMAL LOW (ref 101–111)
Creatinine, Ser: 0.8 mg/dL (ref 0.44–1.00)
GFR calc non Af Amer: >60 mL/min (ref >60)
Glucose, Bld: 96 mg/dL (ref 65–99)
POTASSIUM: 4.1 mmol/L (ref 3.5–5.1)
SODIUM: 132 mmol/L — AB (ref 135–145)

## 2016-09-05 NOTE — Progress Notes (Signed)
Survivorship Care Plan visit completed.  Treatment summary reviewed and given to patient.  ASCO answers booklet reviewed and given to patient.  CARE program and Cancer Transitions discussed with patient along with other resources cancer center offers to patients and caregivers.  Patient verbalized understanding.    

## 2016-11-06 ENCOUNTER — Other Ambulatory Visit: Payer: Self-pay | Admitting: Oncology

## 2016-11-06 DIAGNOSIS — C50812 Malignant neoplasm of overlapping sites of left female breast: Secondary | ICD-10-CM

## 2016-11-06 DIAGNOSIS — Z170 Estrogen receptor positive status [ER+]: Principal | ICD-10-CM

## 2016-12-18 ENCOUNTER — Ambulatory Visit
Admission: RE | Admit: 2016-12-18 | Discharge: 2016-12-18 | Disposition: A | Discharge status: Home / Self Care | Payer: Managed Care, Other (non HMO) | Source: Ambulatory Visit | Attending: Oncology | Admitting: Oncology

## 2016-12-18 DIAGNOSIS — M818 Other osteoporosis without current pathological fracture: Secondary | ICD-10-CM | POA: Insufficient documentation

## 2016-12-18 DIAGNOSIS — C50812 Malignant neoplasm of overlapping sites of left female breast: Secondary | ICD-10-CM | POA: Diagnosis present

## 2016-12-18 DIAGNOSIS — Z170 Estrogen receptor positive status [ER+]: Secondary | ICD-10-CM | POA: Diagnosis present

## 2017-01-04 ENCOUNTER — Other Ambulatory Visit: Payer: Self-pay

## 2017-01-04 ENCOUNTER — Ambulatory Visit
Admission: RE | Admit: 2017-01-04 | Discharge: 2017-01-04 | Disposition: A | Discharge status: Home / Self Care | Payer: Managed Care, Other (non HMO) | Source: Ambulatory Visit | Attending: Radiation Oncology | Admitting: Radiation Oncology

## 2017-01-04 ENCOUNTER — Encounter: Payer: Self-pay | Admitting: Radiation Oncology

## 2017-01-04 VITALS — BP 127/75 | HR 79 | Temp 97.0°F | Wt 150.1 lb

## 2017-01-04 DIAGNOSIS — Z923 Personal history of irradiation: Secondary | ICD-10-CM | POA: Diagnosis not present

## 2017-01-04 DIAGNOSIS — Z79810 Long term (current) use of selective estrogen receptor modulators (SERMs): Secondary | ICD-10-CM | POA: Insufficient documentation

## 2017-01-04 DIAGNOSIS — Z170 Estrogen receptor positive status [ER+]: Secondary | ICD-10-CM | POA: Diagnosis not present

## 2017-01-04 DIAGNOSIS — C50812 Malignant neoplasm of overlapping sites of left female breast: Secondary | ICD-10-CM | POA: Insufficient documentation

## 2017-01-04 NOTE — Progress Notes (Signed)
Radiation Oncology Follow up Note  Name: Marisa Evans   Date:   01/04/2017 MRN:  [id number] DOB: 06-15-1955    This 61 y.o. female presents to the clinic today for 1 year follow-up status post whole breast radiation to her left breast for ER/PR positive invasive lobular carcinoma.  REFERRING PROVIDER: Adin Hector, MD  HPI: Patient is a 61 year old female now out 1 year having completed whole breast radiation to her left breast for ER/PR positive invasive lobular carcinoma. Seen today in routine follow-up she is doing well. She specifically denies breast tenderness cough or bone pain.. She is currently on tamoxifen tolerating that well without side effect. She is also on Fosamax. Her last mammogram which I have reviewed was back in January 2017 BI-RADS 2 benign. She is oriented scheduled for follow-up mammogram next month which I will review  COMPLICATIONS OF TREATMENT: none  FOLLOW UP COMPLIANCE: keeps appointments   PHYSICAL EXAM:  BP 127/75   Pulse 79   Temp (!) 97 F (36.1 C)   Wt 150 lb 2.1 oz (68.1 kg)   BMI 25.77 kg/m  Lungs are clear to A&P cardiac examination essentially unremarkable with regular rate and rhythm. No dominant mass or nodularity is noted in either breast in 2 positions examined. Incision is well-healed. No axillary or supraclavicular adenopathy is appreciated. Cosmetic result is excellent. Well-developed well-nourished patient in NAD. HEENT reveals PERLA, EOMI, discs not visualized.  Oral cavity is clear. No oral mucosal lesions are identified. Neck is clear without evidence of cervical or supraclavicular adenopathy. Lungs are clear to A&P. Cardiac examination is essentially unremarkable with regular rate and rhythm without murmur rub or thrill. Abdomen is benign with no organomegaly or masses noted. Motor sensory and DTR levels are equal and symmetric in the upper and lower extremities. Cranial nerves II through XII are grossly intact. Proprioception is  intact. No peripheral adenopathy or edema is identified. No motor or sensory levels are noted. Crude visual fields are within normal range.  RADIOLOGY RESULTS: Mammograms are reviewed will follow-up and review her mammogram in January  PLAN: Present time patient continues to do well with no evidence of disease. I'm please were overall progress. I've asked to see her back in 1 year for follow-up. She knows to call sooner with any concerns.  I would like to take this opportunity to thank you for allowing me to participate in the care of your patient.Armstead Peaks., MD

## 2017-01-12 ENCOUNTER — Other Ambulatory Visit: Payer: Self-pay | Admitting: Surgery

## 2017-01-12 DIAGNOSIS — Z853 Personal history of malignant neoplasm of breast: Secondary | ICD-10-CM

## 2017-03-02 ENCOUNTER — Ambulatory Visit
Admission: RE | Admit: 2017-03-02 | Discharge: 2017-03-02 | Disposition: A | Discharge status: Home / Self Care | Payer: Managed Care, Other (non HMO) | Source: Ambulatory Visit | Attending: Surgery | Admitting: Surgery

## 2017-03-02 DIAGNOSIS — Z853 Personal history of malignant neoplasm of breast: Secondary | ICD-10-CM

## 2017-03-02 DIAGNOSIS — C50919 Malignant neoplasm of unspecified site of unspecified female breast: Secondary | ICD-10-CM | POA: Insufficient documentation

## 2017-03-02 HISTORY — DX: Personal history of irradiation: Z92.3

## 2017-03-02 HISTORY — DX: Personal history of antineoplastic chemotherapy: Z92.21

## 2017-03-06 ENCOUNTER — Ambulatory Visit: Payer: Managed Care, Other (non HMO) | Admitting: Oncology

## 2017-03-09 NOTE — Progress Notes (Signed)
[place]  Telephone:(336) [phone] Fax:(336) [phone]  ID: Marisa Evans OB: 11-Sep-1955  MR#: [id number]  XBD#:[id number]  Patient Care Team: Adin Hector, MD as PCP - General (Internal Medicine) Lloyd Huger, MD as Consulting Physician (Oncology) Theodore Demark, RN as Oncology Nurse Navigator Tamala Julian, Hillery Aldo, MD as Referring Physician (Surgery)  CHIEF COMPLAINT: Pathologic stage IA ER/PR+, invasive lobular carcinoma of the left breast, overlapping sites  INTERVAL HISTORY: Patient returns to clinic today for routine 6 month evaluation. She continues to tolerate tamoxifen well without significant side effects. She currently feels well and is asymptomatic.  She has no neurologic complaints.  She denies any recent fevers or illnesses.  She has a good appetite and denies weight loss.  She has no chest pain or shortness of breath. She denies any nausea, vomiting, constipation, or diarrhea. She has no urinary complaints. Patient offers no specific complaints today.  REVIEW OF SYSTEMS:   Review of Systems  Constitutional: Negative.  Negative for fever, malaise/fatigue and weight loss.  HENT: Negative.   Respiratory: Negative.  Negative for cough and shortness of breath.   Cardiovascular: Negative.  Negative for chest pain and leg swelling.  Gastrointestinal: Negative.  Negative for abdominal pain, constipation, nausea and vomiting.  Genitourinary: Negative.   Musculoskeletal: Negative.   Skin: Negative.  Negative for rash.  Neurological: Negative.  Negative for sensory change and weakness.  Psychiatric/Behavioral: Negative.  The patient is not nervous/anxious.     As per HPI. Otherwise, a complete review of systems is negative.  PAST MEDICAL HISTORY: Past Medical History:  Diagnosis Date  . Anxiety   . Arthritis    knees  . Breast cancer ([place]) 09/14/2015   left breast lumpectomy/chemo and Radiation  . Cancer ([place])   . Family history of  adverse reaction to anesthesia    PTS UNCLE-PARALYZED AFTER SURGERY AND IT TOOK A WHILE BEFORE HE COULD MOVE AGAIN  . GERD (gastroesophageal reflux disease)    NO MEDS  . H/O breast biopsy   . H/O tubal ligation   . Migraine    not a problem as of now...due to hormones  . Osteoporosis   . Personal history of chemotherapy   . Personal history of radiation therapy   . Tremor    TAKES PROPRANOLOL FOR TREMORS    PAST SURGICAL HISTORY: Past Surgical History:  Procedure Laterality Date  . BREAST BIOPSY Bilateral 1996   neg  . BREAST BIOPSY Left 2017   positive  . PARTIAL MASTECTOMY WITH AXILLARY SENTINEL LYMPH NODE BIOPSY AND NEEDLE Left 09/14/2015   Procedure: PARTIAL MASTECTOMY WITH AXILLARY SENTINEL LYMPH NODE BIOPSY AND NEEDLE LOCALIZATION AND MAMMOSITE EVAL DEVICE;  Surgeon: Leonie Green, MD;  Location: [facility] ORS;  Service: General;  Laterality: Left;  . PORTACATH PLACEMENT Right 03/02/2015   Procedure: INSERTION PORT-A-CATH;  Surgeon: Leonie Green, MD;  Location: [facility] ORS;  Service: General;  Laterality: Right;  . TUBAL LIGATION    . WRIST FRACTURE SURGERY Right 2014   titanium plate in right wrist    FAMILY HISTORY Family History  Problem Relation Age of Onset  . Hypertension Father   . Cancer Maternal Uncle   . Cancer Paternal Uncle   . Liver disease Paternal Grandfather        ADVANCED DIRECTIVES:    HEALTH MAINTENANCE: Social History   Tobacco Use  . Smoking status: Never Smoker  . Smokeless tobacco: Never Used  Substance Use Topics  . Alcohol  use: Yes    Comment: RARE  . Drug use: No     Allergies  Allergen Reactions  . Codeine Nausea Only  . Topamax [Topiramate] Other (See Comments)    Lightheaded, feels "out of body"    Current Outpatient Medications  Medication Sig Dispense Refill  . alendronate (FOSAMAX) 70 MG tablet TAKE 1 TABLET BY MOUTH EVERY WEEK-FRIDAY    . calcium-vitamin D 250-100 MG-UNIT tablet Take 1 tablet by mouth 2  (two) times daily. Reported on 02/25/2015    . Cholecalciferol (D 1000) 1000 units capsule Take 1,000 Units by mouth daily.    . Glucosamine-Chondroitin-MSM 375-300-250 MG TABS Take 1 tablet by mouth 2 (two) times daily.     . propranolol (INDERAL) 10 MG tablet Take 10 mg by mouth 2 (two) times daily. PT TAKES THIS FOR TREMORS    . tamoxifen (NOLVADEX) 20 MG tablet TAKE ONE TABLET EVERY DAY 90 tablet 1  . venlafaxine XR (EFFEXOR-XR) 150 MG 24 hr capsule TAKE ONE CAPSULE BY MOUTH EVERY DAY-PM     No current facility-administered medications for this visit.     OBJECTIVE: Vitals:   03/14/17 1107  BP: 128/82  Pulse: 81  Temp: 98.2 F (36.8 C)     Body mass index is 25.88 kg/m.    ECOG FS:0 - Asymptomatic  General: Well-developed, well-nourished, no acute distress. Eyes: Pink conjunctiva, anicteric sclera. Breasts: Patient requested exam be deferred today. Lungs: Clear to auscultation bilaterally. Heart: Regular rate and rhythm. No rubs, murmurs, or gallops. Breasts: Well healing surgical scar on left breast. Abdomen: Soft, nontender, nondistended. No organomegaly noted, normoactive bowel sounds. Musculoskeletal: No edema, cyanosis, or clubbing. Neuro: Alert, answering all questions appropriately. Cranial nerves grossly intact. Skin: No rash or petechiae noted. Psych: Normal affect.  LAB RESULTS:  Lab Results  Component Value Date   NA 132 (L) 09/05/2016   K 4.1 09/05/2016   CL 100 (L) 09/05/2016   CO2 26 09/05/2016   GLUCOSE 96 09/05/2016   BUN 17 09/05/2016   CREATININE 0.80 09/05/2016   CALCIUM 9.0 09/05/2016   PROT 6.7 11/11/2015   ALBUMIN 4.1 11/11/2015   AST 22 11/11/2015   ALT 16 11/11/2015   ALKPHOS 50 11/11/2015   BILITOT 0.5 11/11/2015   GFRNONAA >60 09/05/2016   GFRAA >60 09/05/2016    Lab Results  Component Value Date   WBC 3.4 (L) 11/11/2015   NEUTROABS 2.0 11/11/2015   HGB 12.4 11/11/2015   HCT 35.4 11/11/2015   MCV 94.0 11/11/2015   PLT 227  11/11/2015     STUDIES: Mm Diag Breast Tomo Bilateral  Result Date: 03/02/2017 CLINICAL DATA:  Status post left breast lumpectomy 2017. EXAM: 2D DIGITAL DIAGNOSTIC BILATERAL MAMMOGRAM WITH CAD AND ADJUNCT TOMO COMPARISON:  Previous exam(s). ACR Breast Density Category c: The breast tissue is heterogeneously dense, which may obscure small masses. FINDINGS: Stable postlumpectomy changes left breast. No concerning masses, calcifications or nonsurgical distortion identified within either breast. Mammographic images were processed with CAD. IMPRESSION: No mammographic evidence for malignancy. RECOMMENDATION: Bilateral diagnostic mammography 1 year. I have discussed the findings and recommendations with the patient. Results were also provided in writing at the conclusion of the visit. If applicable, a reminder letter will be sent to the patient regarding the next appointment. BI-RADS CATEGORY  2: Benign. Electronically Signed   By: Lovey Newcomer M.D.   On: 03/02/2017 11:32    ASSESSMENT: Pathologic stage IA ER/PR+, invasive lobular carcinoma of the left breast, overlapping sites.  PLAN:    1. Pathologic stage IA ER/PR+, invasive lobular carcinoma of the left breast, overlapping sites: Patient completed her neoadjuvant chemotherapy on August 02, 2015.  She subsequently underwent lumpectomy on September 14, 2015 which revealed residual disease. Patient's initial clinical stage was IIa. She completed adjuvant XRT in approximately November 2017.  Tamoxifen was initiated secondary to osteoporosis which she will take for total 5 years completing in November 2022. Her most recent mammogram on March 02, 2017 was reported as BI-RADS 2, repeat in February 2020. Return to clinic in 6 months for further evaluation.  2. Anemia: Resolved. 3. Osteoporosis: Bone mineral density completed on December 18, 2016 revealed a T score of -2.5 which is unchanged from one year prior. Continue Fosamax, calcium, and vitamin D. Repeat in  November 2019.  Approximately 20 minutes was spent in discussion of which greater than 50% was consultation.  Patient expressed understanding and was in agreement with this plan. She also understands that She can call clinic at any time with any questions, concerns, or complaints.   Breast cancer [facility])   Staging form: Breast, AJCC 7th Edition     Clinical stage from 02/12/2015: Stage IIA (T2, N0, M0) - Signed by Lloyd Huger, MD on 02/12/2015   Lloyd Huger, MD   03/17/2017 7:29 AM

## 2017-03-14 ENCOUNTER — Other Ambulatory Visit: Payer: Self-pay

## 2017-03-14 ENCOUNTER — Inpatient Hospital Stay: Payer: Managed Care, Other (non HMO) | Attending: Oncology | Admitting: Oncology

## 2017-03-14 ENCOUNTER — Encounter: Payer: Self-pay | Admitting: Oncology

## 2017-03-14 VITALS — BP 128/82 | HR 81 | Temp 98.2°F | Wt 150.8 lb

## 2017-03-14 DIAGNOSIS — K219 Gastro-esophageal reflux disease without esophagitis: Secondary | ICD-10-CM

## 2017-03-14 DIAGNOSIS — F419 Anxiety disorder, unspecified: Secondary | ICD-10-CM | POA: Insufficient documentation

## 2017-03-14 DIAGNOSIS — Z79810 Long term (current) use of selective estrogen receptor modulators (SERMs): Secondary | ICD-10-CM | POA: Diagnosis not present

## 2017-03-14 DIAGNOSIS — Z79899 Other long term (current) drug therapy: Secondary | ICD-10-CM | POA: Diagnosis not present

## 2017-03-14 DIAGNOSIS — Z809 Family history of malignant neoplasm, unspecified: Secondary | ICD-10-CM | POA: Insufficient documentation

## 2017-03-14 DIAGNOSIS — Z9221 Personal history of antineoplastic chemotherapy: Secondary | ICD-10-CM | POA: Diagnosis not present

## 2017-03-14 DIAGNOSIS — C50812 Malignant neoplasm of overlapping sites of left female breast: Secondary | ICD-10-CM | POA: Insufficient documentation

## 2017-03-14 DIAGNOSIS — Z9012 Acquired absence of left breast and nipple: Secondary | ICD-10-CM

## 2017-03-14 DIAGNOSIS — Z170 Estrogen receptor positive status [ER+]: Secondary | ICD-10-CM | POA: Insufficient documentation

## 2017-03-14 DIAGNOSIS — R251 Tremor, unspecified: Secondary | ICD-10-CM | POA: Insufficient documentation

## 2017-03-14 DIAGNOSIS — Z923 Personal history of irradiation: Secondary | ICD-10-CM | POA: Insufficient documentation

## 2017-03-14 DIAGNOSIS — M1990 Unspecified osteoarthritis, unspecified site: Secondary | ICD-10-CM | POA: Insufficient documentation

## 2017-03-14 DIAGNOSIS — M810 Age-related osteoporosis without current pathological fracture: Secondary | ICD-10-CM | POA: Diagnosis not present

## 2017-03-25 IMAGING — MG MM DIGITAL DIAGNOSTIC BILAT W/ TOMO W/ CAD
9 of 15 series · 9 of 31 positions shown · non-contrast
Comparison: Previous exam(s).

CLINICAL DATA: History of left breast cancer in 4867 status post
lumpectomy and radiation therapy.

EXAM:
2D DIGITAL DIAGNOSTIC BILATERAL MAMMOGRAM WITH CAD AND ADJUNCT TOMO

[L ML]
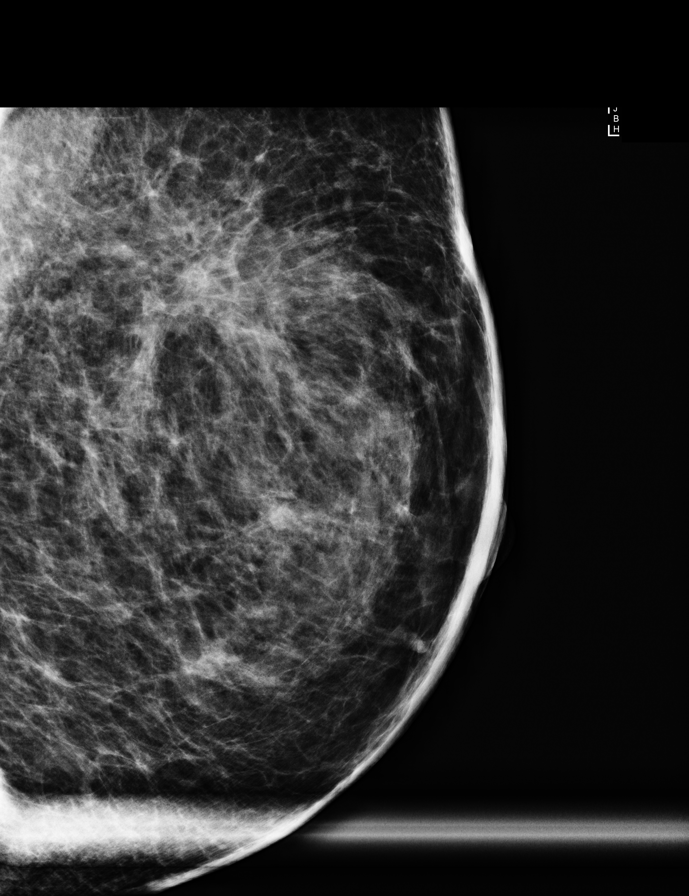

[L CC (1 of 2)]
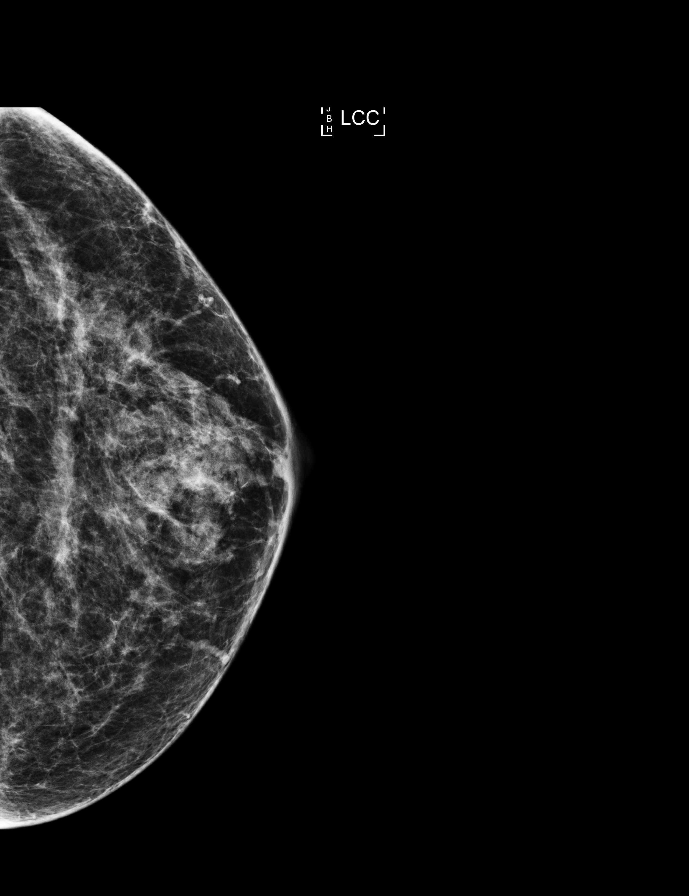

[R CC]
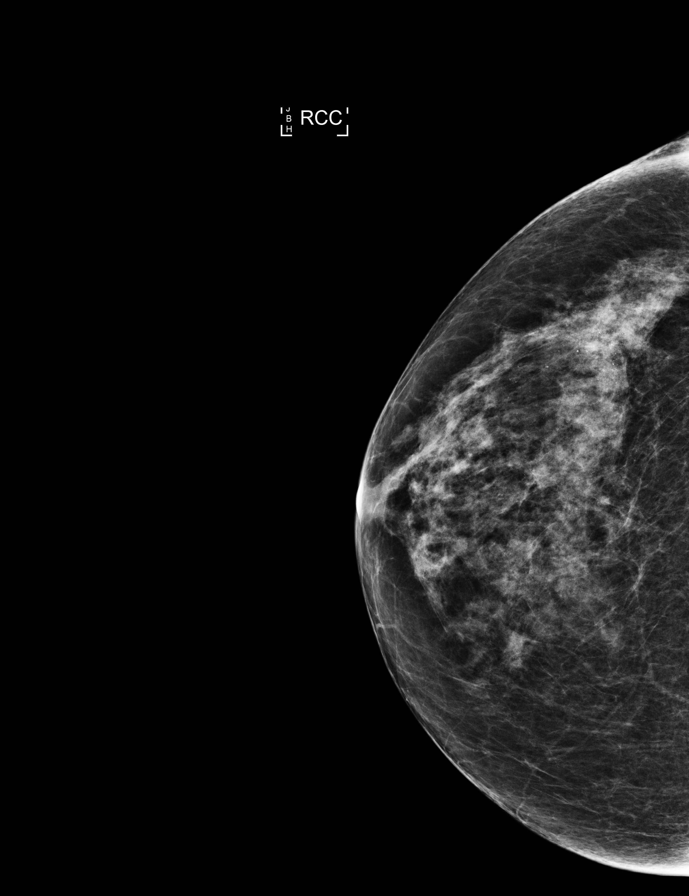

[L MLO]
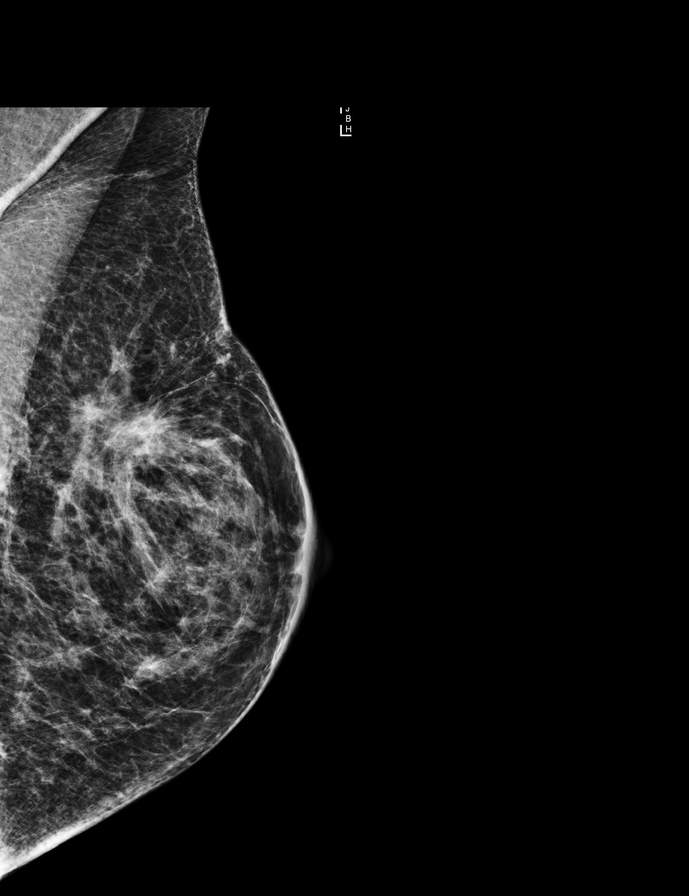

[R MLO synth-2D]
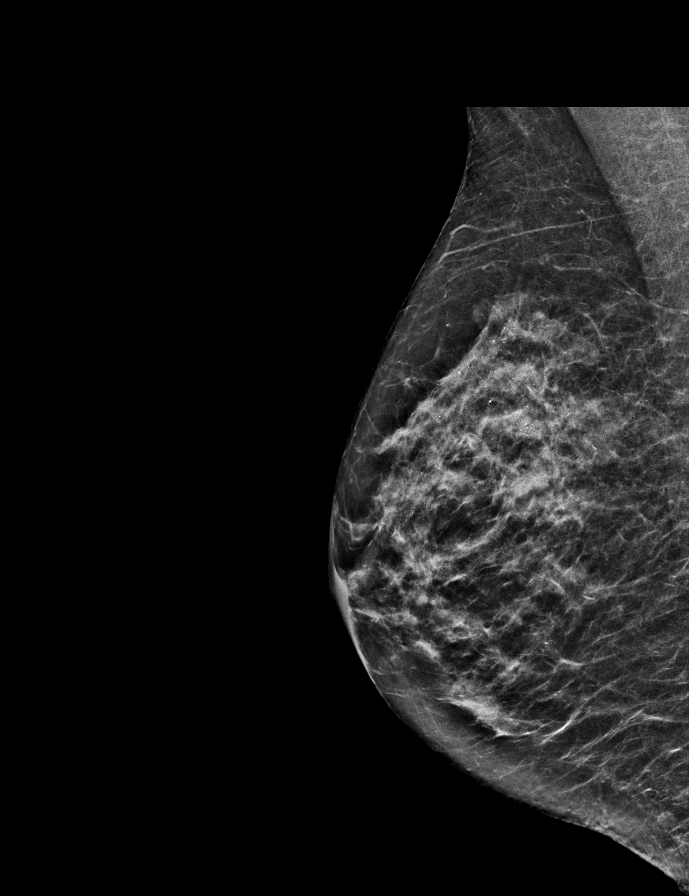

[L MLO synth-2D]
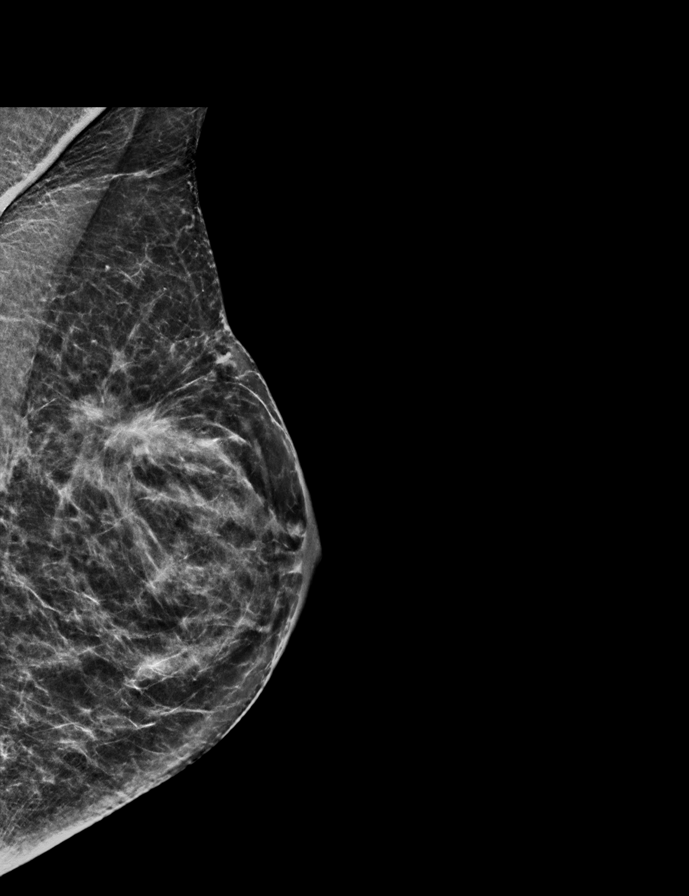

[L CC synth-2D]
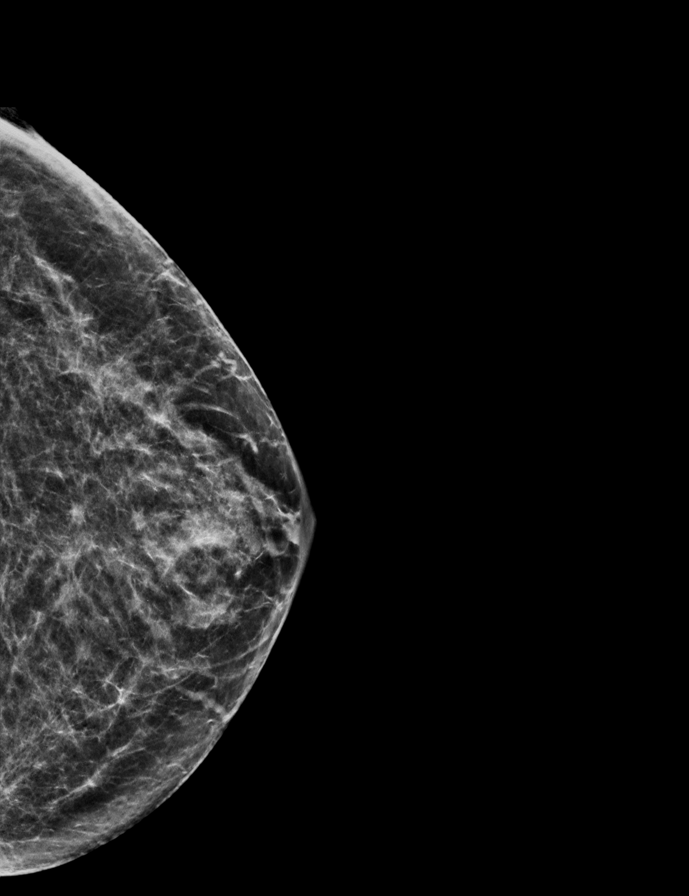

[R CC synth-2D]
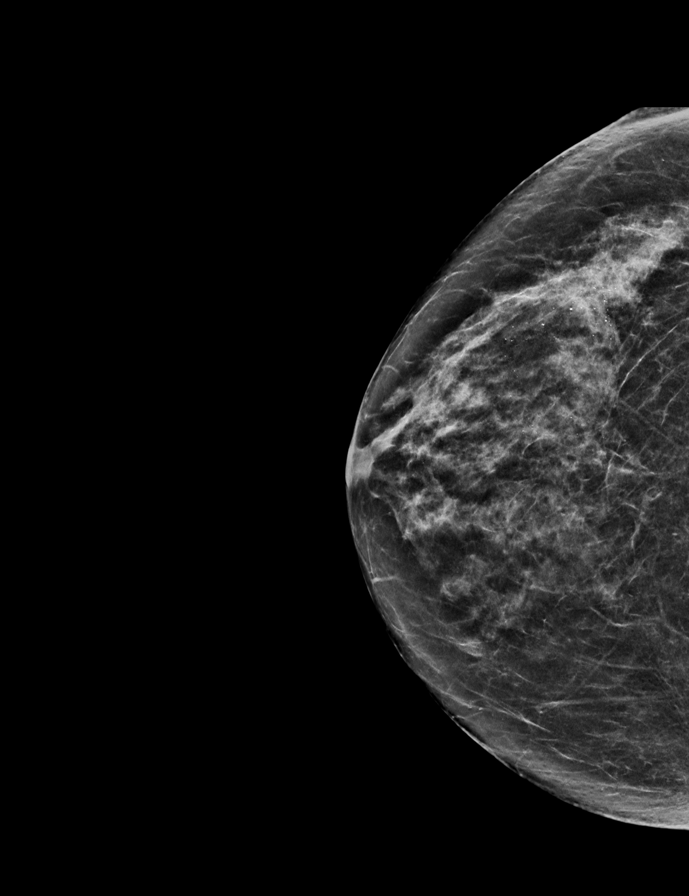

[L CC (2 of 2)]
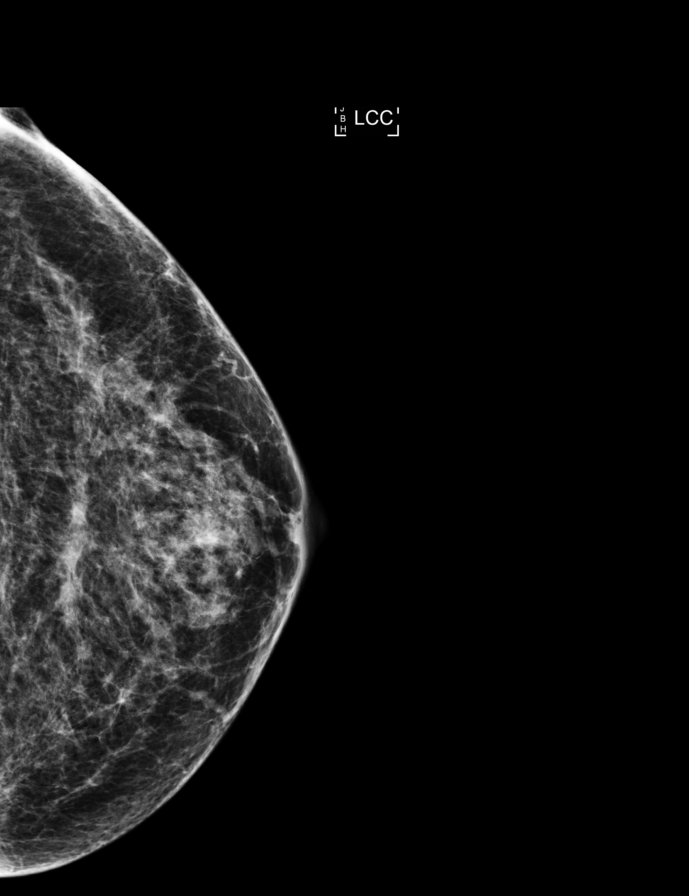

[9 of 31 positions shown; findings below may reference images not displayed]

ACR Breast Density Category c: The breast tissue is heterogeneously
dense, which may obscure small masses.
FINDINGS: There are expected postsurgical changes within the left breast.
There are no new dominant masses, suspicious calcifications or
secondary signs of malignancy within either breast.

Mammographic images were processed with CAD.
IMPRESSION: No evidence of malignancy within either breast. Expected
postsurgical changes within the left breast.

RECOMMENDATION:
Bilateral diagnostic mammogram in 1 year.

I have discussed the findings and recommendations with the patient.
Results were also provided in writing at the conclusion of the
visit. If applicable, a reminder letter will be sent to the patient
regarding the next appointment.

BI-RADS CATEGORY  2: Benign.

## 2017-05-02 ENCOUNTER — Other Ambulatory Visit: Payer: Self-pay | Admitting: Oncology

## 2017-05-02 DIAGNOSIS — Z170 Estrogen receptor positive status [ER+]: Principal | ICD-10-CM

## 2017-05-02 DIAGNOSIS — C50812 Malignant neoplasm of overlapping sites of left female breast: Secondary | ICD-10-CM

## 2017-09-09 NOTE — Progress Notes (Signed)
Patient left without being seen. Dr. Grayland Ormond notified. Scheduling notified to contact patient to re-schedule.

## 2017-09-12 ENCOUNTER — Inpatient Hospital Stay: Payer: Managed Care, Other (non HMO) | Attending: Oncology | Admitting: Oncology

## 2017-09-12 VITALS — BP 118/77 | HR 76 | Temp 97.8°F | Resp 18 | Wt 144.0 lb

## 2017-09-12 DIAGNOSIS — Z170 Estrogen receptor positive status [ER+]: Secondary | ICD-10-CM

## 2017-09-12 DIAGNOSIS — C50812 Malignant neoplasm of overlapping sites of left female breast: Secondary | ICD-10-CM

## 2017-11-01 ENCOUNTER — Encounter: Payer: Self-pay | Admitting: Oncology

## 2017-11-05 ENCOUNTER — Other Ambulatory Visit: Payer: Self-pay | Admitting: Oncology

## 2017-11-05 DIAGNOSIS — C50812 Malignant neoplasm of overlapping sites of left female breast: Secondary | ICD-10-CM

## 2017-11-05 DIAGNOSIS — Z170 Estrogen receptor positive status [ER+]: Principal | ICD-10-CM

## 2018-01-14 ENCOUNTER — Ambulatory Visit
Admission: RE | Admit: 2018-01-14 | Discharge: 2018-01-14 | Disposition: A | Discharge status: Home / Self Care | Payer: Managed Care, Other (non HMO) | Source: Ambulatory Visit | Attending: Radiation Oncology | Admitting: Radiation Oncology

## 2018-01-14 ENCOUNTER — Other Ambulatory Visit: Payer: Self-pay

## 2018-01-14 ENCOUNTER — Encounter: Payer: Self-pay | Admitting: Radiation Oncology

## 2018-01-14 VITALS — BP 116/76 | HR 85 | Temp 98.7°F | Resp 18 | Wt 141.4 lb

## 2018-01-14 DIAGNOSIS — C50812 Malignant neoplasm of overlapping sites of left female breast: Secondary | ICD-10-CM | POA: Insufficient documentation

## 2018-01-14 DIAGNOSIS — Z170 Estrogen receptor positive status [ER+]: Secondary | ICD-10-CM | POA: Insufficient documentation

## 2018-01-14 DIAGNOSIS — Z79810 Long term (current) use of selective estrogen receptor modulators (SERMs): Secondary | ICD-10-CM | POA: Insufficient documentation

## 2018-01-14 DIAGNOSIS — Z923 Personal history of irradiation: Secondary | ICD-10-CM | POA: Diagnosis not present

## 2018-01-14 NOTE — Progress Notes (Signed)
Radiation Oncology Follow up Note  Name: DANAIJA Evans   Date:   01/14/2018 MRN:  [id number] DOB: [birth year]    This 62 y.o. female presents to the clinic today for to year follow-up status post whole breast relation to her left breast for ER/PR positive invasive lobular carcinoma.  REFERRING PROVIDER: Adin Hector, MD  HPI: patient is a 62 year old female now out 2 years having completed whole breast radiation to her left breast for ER/PR positive invasive lobular carcinoma. Seen today in routine follow-up she is doing well. She specifically denies breast tenderness cough or bone pain..she mammograms back in February which were BI-RADS 2 benign.she's currently on tamoxifen tolerating that well without side effect.  COMPLICATIONS OF TREATMENT: none  FOLLOW UP COMPLIANCE: keeps appointments   PHYSICAL EXAM:  BP 116/76   Pulse 85   Temp 98.7 F (37.1 C)   Resp 18   Wt 141 lb 6.8 oz (64.1 kg)   BMI 24.28 kg/m  Lungs are clear to A&P cardiac examination essentially unremarkable with regular rate and rhythm. No dominant mass or nodularity is noted in either breast in 2 positions examined. Incision is well-healed. No axillary or supraclavicular adenopathy is appreciated. Cosmetic result is excellent.Well-developed well-nourished patient in NAD. HEENT reveals PERLA, EOMI, discs not visualized.  Oral cavity is clear. No oral mucosal lesions are identified. Neck is clear without evidence of cervical or supraclavicular adenopathy. Lungs are clear to A&P. Cardiac examination is essentially unremarkable with regular rate and rhythm without murmur rub or thrill. Abdomen is benign with no organomegaly or masses noted. Motor sensory and DTR levels are equal and symmetric in the upper and lower extremities. Cranial nerves II through XII are grossly intact. Proprioception is intact. No peripheral adenopathy or edema is identified. No motor or sensory levels are noted. Crude visual fields are within  normal range.  RADIOLOGY RESULTS: no current films for review  PLAN: present time she continues to do well with no evidence of disease. I'm please were overall progress. I've asked to see her back in 1 year for follow-up. Patient knows to call sooner with any concerns at any time. She continues on tamoxifen without side effect. Follow-up mammograms in February have already been ordered.  I would like to take this opportunity to thank you for allowing me to participate in the care of your patient.Noreene Filbert, MD

## 2018-02-08 ENCOUNTER — Other Ambulatory Visit: Payer: Self-pay | Admitting: General Surgery

## 2018-02-08 DIAGNOSIS — Z853 Personal history of malignant neoplasm of breast: Secondary | ICD-10-CM

## 2018-03-05 ENCOUNTER — Other Ambulatory Visit: Payer: Managed Care, Other (non HMO)

## 2018-03-07 ENCOUNTER — Ambulatory Visit
Admission: RE | Admit: 2018-03-07 | Discharge: 2018-03-07 | Disposition: A | Discharge status: Home / Self Care | Payer: Managed Care, Other (non HMO) | Source: Ambulatory Visit | Attending: General Surgery | Admitting: General Surgery

## 2018-03-07 DIAGNOSIS — Z853 Personal history of malignant neoplasm of breast: Secondary | ICD-10-CM | POA: Diagnosis not present

## 2018-03-08 ENCOUNTER — Other Ambulatory Visit: Payer: Self-pay | Admitting: Oncology

## 2018-03-08 DIAGNOSIS — C50812 Malignant neoplasm of overlapping sites of left female breast: Secondary | ICD-10-CM

## 2018-03-08 DIAGNOSIS — Z170 Estrogen receptor positive status [ER+]: Principal | ICD-10-CM

## 2018-03-12 ENCOUNTER — Other Ambulatory Visit: Payer: Self-pay | Admitting: Oncology

## 2018-03-12 DIAGNOSIS — C50812 Malignant neoplasm of overlapping sites of left female breast: Secondary | ICD-10-CM

## 2018-03-12 DIAGNOSIS — Z170 Estrogen receptor positive status [ER+]: Principal | ICD-10-CM

## 2018-03-14 ENCOUNTER — Other Ambulatory Visit: Payer: Self-pay | Admitting: Oncology

## 2018-03-14 DIAGNOSIS — C50812 Malignant neoplasm of overlapping sites of left female breast: Secondary | ICD-10-CM

## 2018-03-14 DIAGNOSIS — Z170 Estrogen receptor positive status [ER+]: Principal | ICD-10-CM

## 2018-03-14 NOTE — Telephone Encounter (Signed)
Patient has no FOLLOW UP appointment  And according to Mail dose not pan to come back.  "My last appointment was not accurately represented by this note by your nurse. Please correct.  Verlon Au, NP at 09/12/2017 2:45 PM  Patient left without being seen. Dr. Grayland Ormond notified. Scheduling notified to contact patient to re-schedule.   I was present for the 2:45 appt and put in an exam room. Vitals taken. That nurse said she would let Dr. Grayland Ormond know I was there. I heard Dr. Grayland Ormond tell someone he would be in his office. At 3: 26, nurse Zenia Resides came in saying Dr. Grayland Ormond was called out. She said she didn't want to make me wait but she could have a PA see me. Why would I do that when I know Dr. Grayland Ormond was there at 2:45 and apparently he did not feel it necessary to keep the scheduled appointment? Nurse Zenia Resides offered to send me a gas card for my trouble of keeping for the appointment. I have not been contacted to re-schedule but I do not plan to return."

## 2019-01-10 ENCOUNTER — Other Ambulatory Visit: Payer: Self-pay

## 2019-01-13 ENCOUNTER — Other Ambulatory Visit: Payer: Self-pay

## 2019-01-13 ENCOUNTER — Ambulatory Visit
Admission: RE | Admit: 2019-01-13 | Discharge: 2019-01-13 | Disposition: A | Discharge status: Home / Self Care | Payer: Managed Care, Other (non HMO) | Source: Ambulatory Visit | Attending: Radiation Oncology | Admitting: Radiation Oncology

## 2019-01-13 ENCOUNTER — Encounter: Payer: Self-pay | Admitting: Radiation Oncology

## 2019-01-13 VITALS — BP 127/73 | HR 84 | Temp 98.0°F | Resp 18 | Wt 144.5 lb

## 2019-01-13 DIAGNOSIS — Z170 Estrogen receptor positive status [ER+]: Secondary | ICD-10-CM | POA: Insufficient documentation

## 2019-01-13 DIAGNOSIS — Z923 Personal history of irradiation: Secondary | ICD-10-CM | POA: Diagnosis not present

## 2019-01-13 DIAGNOSIS — Z79810 Long term (current) use of selective estrogen receptor modulators (SERMs): Secondary | ICD-10-CM | POA: Diagnosis not present

## 2019-01-13 DIAGNOSIS — C50812 Malignant neoplasm of overlapping sites of left female breast: Secondary | ICD-10-CM | POA: Insufficient documentation

## 2019-01-13 NOTE — Progress Notes (Signed)
Radiation Oncology Follow up Note  Name: Marisa Evans   Date:   01/13/2019 MRN:  [id number] DOB: Mar 09, 1955    This 63 y.o. female presents to the clinic today for 3-year follow-up status post whole breast radiation to her left breast for ER/PR positive invasive lobular carcinoma.Marland Kitchen  REFERRING PROVIDER: Adin Hector, MD  HPI: Patient is a 63 year old female now out 3 years having completed whole breast radiation to her left breast for ER/PR positive invasive lobular carcinoma.  Seen today in routine follow-up she is doing well.  She specifically denies breast tenderness cough or bone pain..  She had mammograms back in February which I have reviewed were BI-RADS 2 benign.  She is currently on tamoxifen tolerating that well without side effect.  COMPLICATIONS OF TREATMENT: none  FOLLOW UP COMPLIANCE: keeps appointments   PHYSICAL EXAM:  BP 127/73   Pulse 84   Temp 98 F (36.7 C)   Resp 18   Wt 144 lb 8 oz (65.5 kg)   BMI 24.80 kg/m  Lungs are clear to A&P cardiac examination essentially unremarkable with regular rate and rhythm. No dominant mass or nodularity is noted in either breast in 2 positions examined. Incision is well-healed. No axillary or supraclavicular adenopathy is appreciated. Cosmetic result is excellent.  Well-developed well-nourished patient in NAD. HEENT reveals PERLA, EOMI, discs not visualized.  Oral cavity is clear. No oral mucosal lesions are identified. Neck is clear without evidence of cervical or supraclavicular adenopathy. Lungs are clear to A&P. Cardiac examination is essentially unremarkable with regular rate and rhythm without murmur rub or thrill. Abdomen is benign with no organomegaly or masses noted. Motor sensory and DTR levels are equal and symmetric in the upper and lower extremities. Cranial nerves II through XII are grossly intact. Proprioception is intact. No peripheral adenopathy or edema is identified. No motor or sensory levels are noted. Crude  visual fields are within normal range.  RADIOLOGY RESULTS: Mammograms reviewed compatible with above-stated findings  PLAN: Present time patient is now 3 years out with no evidence of disease.  I am pleased with her overall progress.  She continues on tamoxifen without side effect.  I have asked to see her back in 1 year for follow-up.  Patient knows to call with any concerns.  I would like to take this opportunity to thank you for allowing me to participate in the care of your patient.Noreene Filbert, MD

## 2019-02-06 ENCOUNTER — Other Ambulatory Visit: Payer: Self-pay | Admitting: General Surgery

## 2019-02-06 DIAGNOSIS — Z853 Personal history of malignant neoplasm of breast: Secondary | ICD-10-CM

## 2019-03-13 ENCOUNTER — Ambulatory Visit
Admission: RE | Admit: 2019-03-13 | Discharge: 2019-03-13 | Disposition: A | Discharge status: Home / Self Care | Payer: Managed Care, Other (non HMO) | Source: Ambulatory Visit | Attending: General Surgery | Admitting: General Surgery

## 2019-03-13 DIAGNOSIS — Z853 Personal history of malignant neoplasm of breast: Secondary | ICD-10-CM

## 2020-01-12 ENCOUNTER — Other Ambulatory Visit: Payer: Self-pay

## 2020-01-12 ENCOUNTER — Ambulatory Visit
Admission: RE | Admit: 2020-01-12 | Discharge: 2020-01-12 | Disposition: A | Discharge status: Home / Self Care | Payer: Managed Care, Other (non HMO) | Source: Ambulatory Visit | Attending: Radiation Oncology | Admitting: Radiation Oncology

## 2020-01-12 ENCOUNTER — Encounter: Payer: Self-pay | Admitting: Radiation Oncology

## 2020-01-12 DIAGNOSIS — Z923 Personal history of irradiation: Secondary | ICD-10-CM | POA: Insufficient documentation

## 2020-01-12 DIAGNOSIS — C50812 Malignant neoplasm of overlapping sites of left female breast: Secondary | ICD-10-CM | POA: Insufficient documentation

## 2020-01-12 DIAGNOSIS — Z170 Estrogen receptor positive status [ER+]: Secondary | ICD-10-CM | POA: Insufficient documentation

## 2020-01-12 DIAGNOSIS — Z79810 Long term (current) use of selective estrogen receptor modulators (SERMs): Secondary | ICD-10-CM | POA: Diagnosis not present

## 2020-01-12 NOTE — Progress Notes (Signed)
Radiation Oncology Follow up Note  Name: Marisa Evans   Date:   01/12/2020 MRN:  [id number] DOB: 11/15/1955    This 64 y.o. female presents to the clinic today for 4-1/2-year follow-up status post whole breast radiation to her left breast for ER/PR positive invasive lobular carcinoma.  REFERRING PROVIDER: Adin Hector, MD  HPI: Patient is a 64 year old female now out close to 5 years having completed whole breast radiation to her left breast for ER/PR positive invasive lobular carcinoma.  Seen today in routine follow-up she is doing well.  She specifically denies breast tenderness cough or bone pain..  She is currently on tamoxifen tolerant well without side effect.  She mammograms back in February which I have reviewed were BI-RADS 2 benign.  COMPLICATIONS OF TREATMENT: none  FOLLOW UP COMPLIANCE: keeps appointments   PHYSICAL EXAM:  BP (P) 122/85 (BP Location: Left Arm, Patient Position: Sitting)   Pulse (P) 80   Temp (!) (P) 97.5 F (36.4 C) (Tympanic)   Resp (P) 16   Wt (P) 141 lb 4.8 oz (64.1 kg)   BMI (P) 24.25 kg/m  Lungs are clear to A&P cardiac examination essentially unremarkable with regular rate and rhythm. No dominant mass or nodularity is noted in either breast in 2 positions examined. Incision is well-healed. No axillary or supraclavicular adenopathy is appreciated. Cosmetic result is excellent.  Well-developed well-nourished patient in NAD. HEENT reveals PERLA, EOMI, discs not visualized.  Oral cavity is clear. No oral mucosal lesions are identified. Neck is clear without evidence of cervical or supraclavicular adenopathy. Lungs are clear to A&P. Cardiac examination is essentially unremarkable with regular rate and rhythm without murmur rub or thrill. Abdomen is benign with no organomegaly or masses noted. Motor sensory and DTR levels are equal and symmetric in the upper and lower extremities. Cranial nerves II through XII are grossly intact. Proprioception is  intact. No peripheral adenopathy or edema is identified. No motor or sensory levels are noted. Crude visual fields are within normal range.  RADIOLOGY RESULTS: Mammograms reviewed compatible with above-stated findings  PLAN: Present time patient is now close to 5 years out with no evidence of disease.  I am going to discontinue follow-up care.  Patient knows to contact me should she have any concerns or problems in the future.  I would like to take this opportunity to thank you for allowing me to participate in the care of your patient.Noreene Filbert, MD

## 2020-02-18 ENCOUNTER — Other Ambulatory Visit: Payer: Self-pay | Admitting: Internal Medicine

## 2020-02-18 DIAGNOSIS — Z1231 Encounter for screening mammogram for malignant neoplasm of breast: Secondary | ICD-10-CM

## 2020-03-15 ENCOUNTER — Other Ambulatory Visit: Payer: Self-pay

## 2020-03-15 ENCOUNTER — Ambulatory Visit
Admission: RE | Admit: 2020-03-15 | Discharge: 2020-03-15 | Disposition: A | Discharge status: Home / Self Care | Payer: Managed Care, Other (non HMO) | Source: Ambulatory Visit | Attending: Internal Medicine | Admitting: Internal Medicine

## 2020-03-15 DIAGNOSIS — Z1231 Encounter for screening mammogram for malignant neoplasm of breast: Secondary | ICD-10-CM | POA: Diagnosis not present

## 2023-08-14 ENCOUNTER — Other Ambulatory Visit: Payer: Self-pay | Admitting: Internal Medicine

## 2023-08-14 DIAGNOSIS — Z1231 Encounter for screening mammogram for malignant neoplasm of breast: Secondary | ICD-10-CM

## 2023-08-27 LAB — COLOGUARD: COLOGUARD: NEGATIVE

## 2023-12-19 ENCOUNTER — Ambulatory Visit
Admission: RE | Admit: 2023-12-19 | Discharge: 2023-12-19 | Disposition: A | Discharge status: Home / Self Care | Source: Ambulatory Visit | Attending: Internal Medicine | Admitting: Internal Medicine

## 2023-12-19 DIAGNOSIS — Z1231 Encounter for screening mammogram for malignant neoplasm of breast: Secondary | ICD-10-CM | POA: Diagnosis present
